# Patient Record
Sex: Female | Born: 1935 | Race: White | Hispanic: No | Marital: Married | Smoking: Never smoker
Health system: Southern US, Community
[De-identification: ages and names within clinical notes are randomized; demographics above are authoritative.]

## PROBLEM LIST (undated history)

## (undated) DIAGNOSIS — E079 Disorder of thyroid, unspecified: Secondary | ICD-10-CM

## (undated) DIAGNOSIS — F329 Major depressive disorder, single episode, unspecified: Secondary | ICD-10-CM

## (undated) DIAGNOSIS — F32A Depression, unspecified: Secondary | ICD-10-CM

## (undated) DIAGNOSIS — K219 Gastro-esophageal reflux disease without esophagitis: Secondary | ICD-10-CM

## (undated) DIAGNOSIS — F0390 Unspecified dementia without behavioral disturbance: Secondary | ICD-10-CM

## (undated) DIAGNOSIS — G510 Bell's palsy: Secondary | ICD-10-CM

## (undated) DIAGNOSIS — I10 Essential (primary) hypertension: Secondary | ICD-10-CM

---

## 1999-01-16 ENCOUNTER — Encounter: Admission: RE | Admit: 1999-01-16 | Discharge: 1999-04-16 | Payer: Self-pay | Admitting: Anesthesiology

## 1999-07-30 ENCOUNTER — Other Ambulatory Visit: Admission: RE | Admit: 1999-07-30 | Discharge: 1999-07-30 | Payer: Self-pay | Admitting: Internal Medicine

## 2001-03-01 ENCOUNTER — Encounter (INDEPENDENT_AMBULATORY_CARE_PROVIDER_SITE_OTHER): Payer: Self-pay | Admitting: Specialist

## 2001-03-01 ENCOUNTER — Ambulatory Visit: Admission: RE | Admit: 2001-03-01 | Discharge: 2001-03-01 | Payer: Self-pay | Admitting: Gastroenterology

## 2001-04-20 ENCOUNTER — Encounter: Admission: RE | Admit: 2001-04-20 | Discharge: 2001-04-20 | Payer: Self-pay | Admitting: Internal Medicine

## 2001-04-20 ENCOUNTER — Encounter: Payer: Self-pay | Admitting: Internal Medicine

## 2001-04-21 ENCOUNTER — Ambulatory Visit: Admission: RE | Admit: 2001-04-21 | Discharge: 2001-04-21 | Payer: Self-pay | Admitting: Internal Medicine

## 2005-12-04 ENCOUNTER — Ambulatory Visit (HOSPITAL_COMMUNITY): Admission: RE | Admit: 2005-12-04 | Discharge: 2005-12-04 | Payer: Self-pay | Admitting: Internal Medicine

## 2006-04-13 ENCOUNTER — Ambulatory Visit: Payer: Self-pay | Admitting: Internal Medicine

## 2006-04-21 ENCOUNTER — Ambulatory Visit: Payer: Self-pay | Admitting: Internal Medicine

## 2007-06-15 ENCOUNTER — Encounter: Admission: RE | Admit: 2007-06-15 | Discharge: 2007-06-15 | Payer: Self-pay | Admitting: Otolaryngology

## 2007-06-28 ENCOUNTER — Ambulatory Visit (HOSPITAL_BASED_OUTPATIENT_CLINIC_OR_DEPARTMENT_OTHER): Admission: RE | Admit: 2007-06-28 | Discharge: 2007-06-29 | Payer: Self-pay | Admitting: Urology

## 2008-11-28 ENCOUNTER — Telehealth: Payer: Self-pay | Admitting: Internal Medicine

## 2009-07-18 ENCOUNTER — Encounter: Admission: RE | Admit: 2009-07-18 | Discharge: 2009-07-18 | Payer: Self-pay | Admitting: Internal Medicine

## 2009-07-26 ENCOUNTER — Encounter: Admission: RE | Admit: 2009-07-26 | Discharge: 2009-07-26 | Payer: Self-pay | Admitting: Internal Medicine

## 2010-01-24 ENCOUNTER — Encounter: Admission: RE | Admit: 2010-01-24 | Discharge: 2010-01-24 | Payer: Self-pay | Admitting: Internal Medicine

## 2010-07-22 ENCOUNTER — Encounter: Admission: RE | Admit: 2010-07-22 | Discharge: 2010-07-22 | Payer: Self-pay | Admitting: Internal Medicine

## 2011-01-07 NOTE — Op Note (Signed)
NAMEBROOKELIN, FELBER             ACCOUNT NO.:  [id number]   MEDICAL RECORD NO.:  [id number]          PATIENT TYPE:  AMB   LOCATION:  [facility]                         FACILITY:  [facility]   PHYSICIAN:  Martina Sinner, MD DATE OF BIRTH:  Jan 02, 1932   DATE OF PROCEDURE:  06/28/2007  DATE OF DISCHARGE:                               OPERATIVE REPORT   ASSISTANT:  Dr. Allena Katz.   DIAGNOSIS:  Rectocele plus pelvic pain.   POSTOPERATIVE DIAGNOSIS:  Rectocele plus pelvic pain.   SURGERY:  Rectocele repair plus graft plus cystoscopy and  hydrodistention.   Pamela Cummings has a symptomatic rectocele and pelvic pain.  She has a past  history of vulvodynia.   The patient is prepped and draped in the usual fashion.  She is given  preoperative antibiotics.  Preoperative lab tests were normal.  Extra  care was taken to position her legs to minimize the risk of compartment  syndrome, neuropathy and DVT.   She is initially cystoscoped with the 21-French scope.  The bladder  mucosa and trigone were normal.  There is no stitch, foreign body or  carcinoma.  She was hydrodistention to approximately 850 mL.  Her  bladder was emptied and reexamined.  There were no glomerulations or  findings in keeping with a diagnosis of interstitial cystitis.   Under anesthesia she had a posterior defect.  Under anesthesia I did not  feel she had an enterocele.  She had a high cystocele that did not  require repair.   Two Allis clamps were was placed posteriorly on the hymenal ring.  I  removed a small triangle of perineal skin.  I then sharply opened up the  posterior vaginal wall for approximately 1 cm from the vaginal apex.  I  sharply dissected the rectovaginal fascia from the vaginal wall to the  pelvic sidewall.   I then used a #8 glove did a digital rectal examination.  She had  diffuse thinning of the posterior vaginal wall.  She also had an apical  defect possibly also due to the sharp dissection.   I did a  posterior repair with two layers in a running fashion with 2-0  Vicryl on an SH needle.  The first running suture flatten the posterior  defect very well.  It was not under tension.  I put approximately four  or five interrupted sutures for the second layer.   I did a little bit of sharp dissection near the apex and could feel the  levator muscles bilaterally.  I did not break through down to the  ischial spines.  I placed a flat thin malleable over the rectum and  placed 0 Vicryl on a UR6 needle in ileal coccygeus muscle both sides  nearer to the apex.  I placed two more sutures just to the pelvic  sidewall and rectovaginal fascia near the introitus.   4 x 7 dermal graft was soaked with antibiotic and saline.  It was  sutured in a four corner fashion leaving a trapdoor that flipped up over  any apical defect.  It was sewn to the vaginal wall  mucosa at the apex.   I trimmed approximately 3 or 4 mm of vaginal wall mucosa bilaterally.  The graft had laid in very nicely.  I did not sew it to the perineal  body.  I closed the posterior vaginal wall with running 2-0 Vicryl on a  CT1 needle.   There was excellent vaginal length.  There was good reduction of the  posterior defect.  There was no ring effect.  I did a digital rectal  examination.  There was no suture in the bladder.  The perineal skin was  closed in an up and down fashion with running 2-0 Vicryl.   Leg position was good at the end of the case.  The urine output was  good.  The Foley leg strap was applied.  The patient was taken to  recovery room.           ______________________________  Martina Sinner, MD  Electronically Signed     SAM/MEDQ  D:  06/28/2007  T:  06/29/2007  Job:  [id number]

## 2011-01-10 NOTE — Op Note (Signed)
[facility]  Patient:    [name], [name]                    MRN: [id number] Proc. Date: 03/01/01 Adm. Date:  [id number] Attending:  Rich Brave                           Operative Report  No dictation. DD:  03/01/01 TD:  03/01/01 Job: [id number]

## 2011-01-10 NOTE — Procedures (Signed)
[place]  Patient:    Pamela Cummings, Pamela Cummings                    MRN: [id number] Proc. Date: 03/01/01 Adm. Date:  [id number] Attending:  Rich Brave CC:         Richard A. Jacky Kindle, M.D.   Procedure Report  PROCEDURE:  Colonoscopy with biopsies.  INDICATION:  A 77 year old female with slightly irregular bowel habits, for colon cancer screening.  FINDINGS:  Diminutive polyp just above the cecum.  Otherwise normal to terminal ileum.  DESCRIPTION OF PROCEDURE:  The nature, purpose, and risks of this procedure had been discussed with the patient, who provided written consent.  Sedation for this procedure and the upper endoscopy which preceded it totalled fentanyl 50 mcg and Versed 5 mg IV without arrhythmias or desaturation (there were some spuriously low readings on the pulse oximeter at the beginning of the exam, but I do not feel that they were reflective of true abnormality in oxygen saturation since there was not a good wave form and the patient looked well).  The Olympus adult video colonoscope was advanced with slight difficulty through a somewhat fixated sigmoid region, which we were able to negotiate by having the patient in the supine position.  The terminal ileum was entered for a short distance and appeared normal.  Biopsies were obtained of it as well as randomly around the colon during pullback in view of the history of irregular bowel habits, which are felt most likely to be due to irritable bowel syndrome.  There was a diminutive 2 mm sessile polyp a short distance above the cecum, removed by one or two cold biopsies.  No other polyps were seen, and there was no evidence of cancer, colitis, vascular malformations, or significant diverticulosis.  There may have been a few diverticula in the sigmoid region. Retroflexion was not performed in the rectum due to a small rectal ampulla.  The patient tolerated the procedure well,  and there were no apparent complications.  IMPRESSION:  Diminutive polyp in the ascending colon.  Otherwise normal exam.  PLAN:  Await pathology on todays biopsies. DD:  03/01/01 TD:  03/01/01 Job: [id number]

## 2011-01-10 NOTE — Procedures (Signed)
[place]  Patient:    Pamela Cummings, Pamela Cummings                    MRN: [id number] Proc. Date: 03/01/01 Adm. Date:  [id number] Attending:  Rich Brave CC:         Richard A. Jacky Kindle, M.D.   Procedure Report  PROCEDURE:  Upper endoscopy.  INDICATION:  Long-standing reflux symptoms in a 77 year old female.  Her symptoms are reasonably well-controlled by the use of Prilosec once or twice daily but found that when she tried switching to Protonix at my suggestion, it did not work as well for her.  FINDINGS:  Small hiatal hernia with minimal esophageal ring.  Mild antral gastritis.  DESCRIPTION OF PROCEDURE:  The nature, purpose, and risks of the procedure had been discussed with the patient, who provided written consent.  Sedation was fentanyl 50 mcg and Versed 5 mg IV without arrhythmias or desaturation.  The Olympus adult video endoscope was passed under direct vision, but I was not able to see the vocal cords well, so I cannot comment on the presence or absence of reflux pharyngitis (the patient does have a tendency to cough when she gets reflux episodes).  The esophageal mucosa was entirely normal.  There was no evidence of free gastroesophageal reflux during this exam nor any reflux esophagitis, Barretts esophagus, or any varices, infection, or neoplasia.  There was a minimal esophageal ring at the squamocolumnar junction, and below this was a 2 cm hiatal hernia.  The stomach contained no significant residual.  There was "sandpaper erythema" in the prepyloric antral region, but no erosions, ulcers, polyps, or masses were observed anywhere in the stomach.  The pylorus, duodenal bulb, and second duodenum looked normal.  The scope was removed from the patient.  She tolerated the procedure well.  No biopsies were obtained.  There were no apparent complications.  IMPRESSION: 1. Small hiatal hernia with minimal esophageal ring. 2.  Prepyloric erythema of questionable clinical significance.  PLAN:  Resume Prilosec, since it works well for her.  Prescription provided for #60 with 11 refills.  Office follow-up p.r.n. DD:  03/01/01 TD:  03/01/01 Job: [id number]

## 2011-06-03 LAB — TYPE AND SCREEN
ABO/RH(D): O POS
Antibody Screen: NEGATIVE

## 2011-06-03 LAB — PROTIME-INR: Prothrombin Time: 12.7

## 2011-06-03 LAB — ABO/RH: ABO/RH(D): O POS

## 2011-06-03 LAB — CBC
HCT: 38.3
Hemoglobin: 11.6 — ABNORMAL LOW
Hemoglobin: 13.2
MCHC: 34.7
MCV: 87.4
MCV: 88.4
MCV: 88.5
RBC: 4.38
RDW: 13.5
RDW: 14
WBC: 6.4
WBC: 8.1

## 2011-06-03 LAB — DIFFERENTIAL
Eosinophils Absolute: 0.1
Eosinophils Relative: 2
Lymphocytes Relative: 22
Lymphs Abs: 1.4
Monocytes Absolute: 0.5
Monocytes Relative: 9

## 2011-06-03 LAB — BASIC METABOLIC PANEL
Chloride: 99
GFR calc non Af Amer: >60
Potassium: 3.7
Sodium: 139

## 2011-09-18 ENCOUNTER — Ambulatory Visit: Payer: Medicare Other | Attending: Internal Medicine | Admitting: Physical Therapy

## 2011-09-18 DIAGNOSIS — R269 Unspecified abnormalities of gait and mobility: Secondary | ICD-10-CM | POA: Insufficient documentation

## 2011-09-18 DIAGNOSIS — IMO0001 Reserved for inherently not codable concepts without codable children: Secondary | ICD-10-CM | POA: Insufficient documentation

## 2011-09-29 ENCOUNTER — Ambulatory Visit: Payer: Medicare Other | Attending: Internal Medicine | Admitting: Physical Therapy

## 2011-09-29 DIAGNOSIS — R269 Unspecified abnormalities of gait and mobility: Secondary | ICD-10-CM | POA: Insufficient documentation

## 2011-09-29 DIAGNOSIS — IMO0001 Reserved for inherently not codable concepts without codable children: Secondary | ICD-10-CM | POA: Insufficient documentation

## 2011-10-02 ENCOUNTER — Ambulatory Visit: Payer: Medicare Other | Admitting: Physical Therapy

## 2011-10-06 ENCOUNTER — Ambulatory Visit: Payer: Medicare Other | Admitting: Physical Therapy

## 2011-10-09 ENCOUNTER — Ambulatory Visit: Payer: Medicare Other | Admitting: Physical Therapy

## 2011-10-14 ENCOUNTER — Encounter: Payer: Self-pay | Admitting: Physical Therapy

## 2011-10-14 ENCOUNTER — Ambulatory Visit: Payer: Medicare Other | Admitting: Physical Therapy

## 2011-10-16 ENCOUNTER — Encounter: Payer: Self-pay | Admitting: Physical Therapy

## 2011-10-20 ENCOUNTER — Encounter: Payer: Self-pay | Admitting: Physical Therapy

## 2011-10-23 ENCOUNTER — Encounter: Payer: Self-pay | Admitting: Physical Therapy

## 2011-11-10 ENCOUNTER — Ambulatory Visit: Payer: Medicare Other | Attending: Internal Medicine | Admitting: Physical Therapy

## 2011-11-10 DIAGNOSIS — R269 Unspecified abnormalities of gait and mobility: Secondary | ICD-10-CM | POA: Insufficient documentation

## 2011-11-10 DIAGNOSIS — IMO0001 Reserved for inherently not codable concepts without codable children: Secondary | ICD-10-CM | POA: Insufficient documentation

## 2011-11-12 ENCOUNTER — Ambulatory Visit: Payer: Medicare Other | Admitting: Physical Therapy

## 2011-11-17 ENCOUNTER — Encounter: Payer: PRIVATE HEALTH INSURANCE | Admitting: Physical Therapy

## 2011-11-18 ENCOUNTER — Encounter: Payer: PRIVATE HEALTH INSURANCE | Admitting: Physical Therapy

## 2011-11-18 ENCOUNTER — Ambulatory Visit: Payer: Medicare Other | Admitting: *Deleted

## 2011-11-24 ENCOUNTER — Ambulatory Visit: Payer: Medicare Other | Attending: Internal Medicine | Admitting: Physical Therapy

## 2011-11-24 DIAGNOSIS — IMO0001 Reserved for inherently not codable concepts without codable children: Secondary | ICD-10-CM | POA: Insufficient documentation

## 2011-11-24 DIAGNOSIS — R269 Unspecified abnormalities of gait and mobility: Secondary | ICD-10-CM | POA: Insufficient documentation

## 2011-11-26 ENCOUNTER — Ambulatory Visit: Payer: Medicare Other | Admitting: Physical Therapy

## 2011-12-01 ENCOUNTER — Encounter: Payer: PRIVATE HEALTH INSURANCE | Admitting: Physical Therapy

## 2011-12-04 ENCOUNTER — Ambulatory Visit: Payer: Medicare Other | Admitting: Physical Therapy

## 2011-12-08 ENCOUNTER — Encounter: Payer: PRIVATE HEALTH INSURANCE | Admitting: Physical Therapy

## 2011-12-08 ENCOUNTER — Ambulatory Visit: Payer: Medicare Other | Admitting: Physical Therapy

## 2011-12-11 ENCOUNTER — Ambulatory Visit: Payer: Medicare Other | Admitting: Physical Therapy

## 2011-12-11 ENCOUNTER — Encounter: Payer: PRIVATE HEALTH INSURANCE | Admitting: Physical Therapy

## 2011-12-15 ENCOUNTER — Ambulatory Visit: Payer: Medicare Other | Admitting: Physical Therapy

## 2011-12-16 ENCOUNTER — Ambulatory Visit: Payer: Medicare Other | Admitting: Physical Therapy

## 2011-12-22 ENCOUNTER — Ambulatory Visit: Payer: Medicare Other | Admitting: Physical Therapy

## 2011-12-23 ENCOUNTER — Ambulatory Visit: Payer: Medicare Other | Admitting: Physical Therapy

## 2011-12-30 ENCOUNTER — Ambulatory Visit: Payer: Medicare Other | Attending: Internal Medicine | Admitting: Physical Therapy

## 2011-12-30 DIAGNOSIS — R269 Unspecified abnormalities of gait and mobility: Secondary | ICD-10-CM | POA: Insufficient documentation

## 2011-12-30 DIAGNOSIS — IMO0001 Reserved for inherently not codable concepts without codable children: Secondary | ICD-10-CM | POA: Insufficient documentation

## 2012-01-01 ENCOUNTER — Ambulatory Visit: Payer: Medicare Other | Admitting: Physical Therapy

## 2012-01-05 ENCOUNTER — Ambulatory Visit: Payer: Medicare Other | Admitting: Physical Therapy

## 2012-01-08 ENCOUNTER — Ambulatory Visit: Payer: Medicare Other | Admitting: Physical Therapy

## 2012-01-12 ENCOUNTER — Ambulatory Visit: Payer: Medicare Other | Admitting: Physical Therapy

## 2012-01-15 ENCOUNTER — Ambulatory Visit: Payer: Medicare Other | Admitting: Physical Therapy

## 2012-09-13 ENCOUNTER — Ambulatory Visit: Payer: Medicare Other | Attending: Internal Medicine | Admitting: Physical Therapy

## 2012-09-13 DIAGNOSIS — IMO0001 Reserved for inherently not codable concepts without codable children: Secondary | ICD-10-CM | POA: Insufficient documentation

## 2012-09-13 DIAGNOSIS — R269 Unspecified abnormalities of gait and mobility: Secondary | ICD-10-CM | POA: Insufficient documentation

## 2012-09-16 ENCOUNTER — Ambulatory Visit: Payer: Medicare Other | Admitting: Physical Therapy

## 2012-09-20 ENCOUNTER — Ambulatory Visit: Payer: Medicare Other | Admitting: Physical Therapy

## 2012-09-23 ENCOUNTER — Ambulatory Visit: Payer: Medicare Other | Admitting: Physical Therapy

## 2012-09-28 ENCOUNTER — Ambulatory Visit: Payer: Medicare Other | Attending: Internal Medicine | Admitting: Physical Therapy

## 2012-09-28 DIAGNOSIS — R269 Unspecified abnormalities of gait and mobility: Secondary | ICD-10-CM | POA: Insufficient documentation

## 2012-09-28 DIAGNOSIS — IMO0001 Reserved for inherently not codable concepts without codable children: Secondary | ICD-10-CM | POA: Insufficient documentation

## 2012-09-30 ENCOUNTER — Ambulatory Visit: Payer: Medicare Other | Admitting: Physical Therapy

## 2012-10-04 ENCOUNTER — Ambulatory Visit: Payer: Medicare Other | Admitting: Physical Therapy

## 2012-10-07 ENCOUNTER — Ambulatory Visit: Payer: Medicare Other | Admitting: Physical Therapy

## 2012-10-11 ENCOUNTER — Ambulatory Visit: Payer: Medicare Other | Admitting: Physical Therapy

## 2012-10-14 ENCOUNTER — Ambulatory Visit: Payer: Medicare Other | Admitting: Physical Therapy

## 2012-10-26 ENCOUNTER — Ambulatory Visit: Payer: Medicare Other | Admitting: Physical Therapy

## 2013-01-01 ENCOUNTER — Inpatient Hospital Stay (HOSPITAL_COMMUNITY)
Admission: EM | Admit: 2013-01-01 | Discharge: 2013-01-05 | DRG: 419 | Disposition: A | Discharge status: Skilled Nursing Facility | Payer: Medicare Other | Attending: General Surgery | Admitting: General Surgery

## 2013-01-01 ENCOUNTER — Emergency Department (HOSPITAL_COMMUNITY): Payer: Medicare Other

## 2013-01-01 ENCOUNTER — Other Ambulatory Visit: Payer: Self-pay

## 2013-01-01 ENCOUNTER — Encounter (HOSPITAL_COMMUNITY): Payer: Self-pay | Admitting: Emergency Medicine

## 2013-01-01 DIAGNOSIS — R7402 Elevation of levels of lactic acid dehydrogenase (LDH): Secondary | ICD-10-CM

## 2013-01-01 DIAGNOSIS — I10 Essential (primary) hypertension: Secondary | ICD-10-CM | POA: Diagnosis present

## 2013-01-01 DIAGNOSIS — R112 Nausea with vomiting, unspecified: Secondary | ICD-10-CM

## 2013-01-01 DIAGNOSIS — K219 Gastro-esophageal reflux disease without esophagitis: Secondary | ICD-10-CM | POA: Diagnosis present

## 2013-01-01 DIAGNOSIS — M549 Dorsalgia, unspecified: Secondary | ICD-10-CM | POA: Diagnosis present

## 2013-01-01 DIAGNOSIS — R509 Fever, unspecified: Secondary | ICD-10-CM

## 2013-01-01 DIAGNOSIS — K660 Peritoneal adhesions (postprocedural) (postinfection): Secondary | ICD-10-CM | POA: Diagnosis present

## 2013-01-01 DIAGNOSIS — G8929 Other chronic pain: Secondary | ICD-10-CM | POA: Diagnosis present

## 2013-01-01 DIAGNOSIS — R197 Diarrhea, unspecified: Secondary | ICD-10-CM | POA: Diagnosis not present

## 2013-01-01 DIAGNOSIS — R933 Abnormal findings on diagnostic imaging of other parts of digestive tract: Secondary | ICD-10-CM

## 2013-01-01 DIAGNOSIS — K8010 Calculus of gallbladder with chronic cholecystitis without obstruction: Secondary | ICD-10-CM

## 2013-01-01 DIAGNOSIS — R070 Pain in throat: Secondary | ICD-10-CM | POA: Diagnosis not present

## 2013-01-01 DIAGNOSIS — K8000 Calculus of gallbladder with acute cholecystitis without obstruction: Principal | ICD-10-CM

## 2013-01-01 DIAGNOSIS — Z79899 Other long term (current) drug therapy: Secondary | ICD-10-CM

## 2013-01-01 DIAGNOSIS — F0390 Unspecified dementia without behavioral disturbance: Secondary | ICD-10-CM | POA: Diagnosis present

## 2013-01-01 HISTORY — DX: Essential (primary) hypertension: I10

## 2013-01-01 HISTORY — DX: Unspecified dementia, unspecified severity, without behavioral disturbance, psychotic disturbance, mood disturbance, and anxiety: F03.90

## 2013-01-01 HISTORY — DX: Gastro-esophageal reflux disease without esophagitis: K21.9

## 2013-01-01 LAB — COMPREHENSIVE METABOLIC PANEL
ALT: 215 U/L — ABNORMAL HIGH (ref 0–35)
AST: 231 U/L — ABNORMAL HIGH (ref 0–37)
Albumin: 3.2 g/dL — ABNORMAL LOW (ref 3.5–5.2)
CO2: 27 mEq/L (ref 19–32)
Calcium: 9.1 mg/dL (ref 8.4–10.5)
Creatinine, Ser: 0.82 mg/dL (ref 0.50–1.10)
GFR calc non Af Amer: 67 mL/min — ABNORMAL LOW (ref >90)
Sodium: 135 mEq/L (ref 135–145)

## 2013-01-01 LAB — URINALYSIS, ROUTINE W REFLEX MICROSCOPIC
Glucose, UA: NEGATIVE mg/dL
Hgb urine dipstick: NEGATIVE
Specific Gravity, Urine: 1.012 (ref 1.005–1.030)
pH: 5.5 (ref 5.0–8.0)

## 2013-01-01 LAB — CBC
HCT: 37.4 % (ref 36.0–46.0)
MCH: 30.8 pg (ref 26.0–34.0)
MCH: 30.6 pg (ref 26.0–34.0)
MCV: 90.8 fL (ref 78.0–100.0)
Platelets: 180 10*3/uL (ref 150–400)
Platelets: 156 10*3/uL (ref 150–400)
RBC: 4.28 MIL/uL (ref 3.87–5.11)
RBC: 4.12 MIL/uL (ref 3.87–5.11)
RDW: 14.3 % (ref 11.5–15.5)
WBC: 7.4 10*3/uL (ref 4.0–10.5)

## 2013-01-01 LAB — LIPASE, BLOOD: Lipase: 18 U/L (ref 11–59)

## 2013-01-01 LAB — TROPONIN I: Troponin I: <0.3 ng/mL (ref <0.30)

## 2013-01-01 MED ORDER — ATENOLOL 50 MG PO TABS
50.0000 mg | ORAL_TABLET | Freq: Every day | ORAL | Status: DC
  Administered 2013-01-03 – 2013-01-05 (×3): 50 mg via ORAL
  Filled 2013-01-01 (×4): qty 1

## 2013-01-01 MED ORDER — GABAPENTIN 300 MG PO CAPS
300.0000 mg | ORAL_CAPSULE | Freq: Every day | ORAL | Status: DC
  Administered 2013-01-02 – 2013-01-05 (×4): 300 mg via ORAL
  Filled 2013-01-01 (×4): qty 1

## 2013-01-01 MED ORDER — PIPERACILLIN-TAZOBACTAM 3.375 G IVPB
3.3750 g | Freq: Three times a day (TID) | INTRAVENOUS | Status: DC
  Administered 2013-01-01 – 2013-01-05 (×11): 3.375 g via INTRAVENOUS
  Filled 2013-01-01 (×13): qty 50

## 2013-01-01 MED ORDER — VILAZODONE HCL 40 MG PO TABS
40.0000 mg | ORAL_TABLET | Freq: Every day | ORAL | Status: DC
  Administered 2013-01-02 – 2013-01-05 (×4): 40 mg via ORAL
  Filled 2013-01-01 (×5): qty 1

## 2013-01-01 MED ORDER — ZOLPIDEM TARTRATE 5 MG PO TABS
5.0000 mg | ORAL_TABLET | Freq: Every evening | ORAL | Status: DC | PRN
  Administered 2013-01-02 – 2013-01-04 (×3): 5 mg via ORAL
  Filled 2013-01-01 (×5): qty 1

## 2013-01-01 MED ORDER — PANTOPRAZOLE SODIUM 40 MG IV SOLR
40.0000 mg | Freq: Every day | INTRAVENOUS | Status: DC
  Administered 2013-01-01 – 2013-01-04 (×5): 40 mg via INTRAVENOUS
  Filled 2013-01-01 (×7): qty 40

## 2013-01-01 MED ORDER — POTASSIUM CHLORIDE 10 MEQ/100ML IV SOLN
10.0000 meq | Freq: Once | INTRAVENOUS | Status: AC
  Administered 2013-01-01: 10 meq via INTRAVENOUS
  Filled 2013-01-01: qty 100

## 2013-01-01 MED ORDER — SODIUM CHLORIDE 0.9 % IV BOLUS (SEPSIS)
500.0000 mL | Freq: Once | INTRAVENOUS | Status: AC
  Administered 2013-01-01: 500 mL via INTRAVENOUS

## 2013-01-01 MED ORDER — CYCLOBENZAPRINE HCL 10 MG PO TABS
10.0000 mg | ORAL_TABLET | Freq: Every day | ORAL | Status: DC
  Administered 2013-01-01 – 2013-01-04 (×5): 10 mg via ORAL
  Filled 2013-01-01 (×7): qty 1

## 2013-01-01 MED ORDER — POTASSIUM CHLORIDE IN NACL 20-0.9 MEQ/L-% IV SOLN
INTRAVENOUS | Status: DC
  Administered 2013-01-01: 19:00:00 via INTRAVENOUS
  Filled 2013-01-01 (×13): qty 1000

## 2013-01-01 MED ORDER — ONDANSETRON HCL 4 MG/2ML IJ SOLN
4.0000 mg | Freq: Once | INTRAMUSCULAR | Status: AC
  Administered 2013-01-01: 4 mg via INTRAVENOUS
  Filled 2013-01-01: qty 2

## 2013-01-01 MED ORDER — DONEPEZIL HCL 10 MG PO TABS
10.0000 mg | ORAL_TABLET | Freq: Every day | ORAL | Status: DC
  Administered 2013-01-01 – 2013-01-04 (×5): 10 mg via ORAL
  Filled 2013-01-01 (×6): qty 1

## 2013-01-01 MED ORDER — PIPERACILLIN-TAZOBACTAM 3.375 G IVPB
3.3750 g | Freq: Once | INTRAVENOUS | Status: AC
  Administered 2013-01-01: 3.375 g via INTRAVENOUS
  Filled 2013-01-01: qty 50

## 2013-01-01 MED ORDER — MORPHINE SULFATE 2 MG/ML IJ SOLN
2.0000 mg | INTRAMUSCULAR | Status: DC | PRN
  Filled 2013-01-01 (×2): qty 1

## 2013-01-01 MED ORDER — ATENOLOL-CHLORTHALIDONE 50-25 MG PO TABS: 1.0000 | ORAL_TABLET | Freq: Every day | ORAL | Status: DC

## 2013-01-01 MED ORDER — ENOXAPARIN SODIUM 30 MG/0.3ML ~~LOC~~ SOLN
30.0000 mg | SUBCUTANEOUS | Status: DC
  Administered 2013-01-01 – 2013-01-02 (×2): 30 mg via SUBCUTANEOUS
  Filled 2013-01-01 (×4): qty 0.3

## 2013-01-01 MED ORDER — ONDANSETRON HCL 4 MG/2ML IJ SOLN: 4.0000 mg | Freq: Four times a day (QID) | INTRAMUSCULAR | Status: DC | PRN

## 2013-01-01 MED ORDER — CHLORTHALIDONE 25 MG PO TABS
25.0000 mg | ORAL_TABLET | Freq: Every day | ORAL | Status: DC
  Administered 2013-01-02 – 2013-01-05 (×4): 25 mg via ORAL
  Filled 2013-01-01 (×5): qty 1

## 2013-01-01 MED ORDER — ENOXAPARIN SODIUM 30 MG/0.3ML ~~LOC~~ SOLN: 30.0000 mg | SUBCUTANEOUS | Status: DC

## 2013-01-01 NOTE — Consult Note (Signed)
HISTORY OF PRESENT ILLNESS:  Pamela Cummings is a 79 y.o. female with minimal medical problems who presents to the emergency room after several days of abdominal complaints. The patient's husband, retired physician, provide significant history. Basically, she was having some upper abdominal and back discomfort. Had some nausea and vomiting. Became weak. At the hospital was noted to have abnormal liver tests with SGOT 231, SGPT 215, and total bilirubin 5.2. Her alkaline phosphatase was normal at 112. Abdominal ultrasound revealed thickening of the gallbladder wall with pericholecystic fluid and gallstones. No intrahepatic or extrahepatic dilation. She is now on antibiotics resting comfortably  REVIEW OF SYSTEMS:  All non-GI ROS negative except for urinary frequency and difficulty walking. Weakness  Past Medical History  Diagnosis Date  . Dementia   . Hypertension   . GERD (gastroesophageal reflux disease)     History reviewed. No pertinent past surgical history.  Social History Pamela Cummings  has no tobacco, alcohol, and drug history on file.  FAMILY history No family history of relevant GI disorders  No Known Allergies     PHYSICAL EXAMINATION: Vital signs: BP 144/66  Pulse 61  Temp(Src) 98.7 F (37.1 C) (Rectal)  Resp 17  SpO2 93%  Constitutional: generally well-appearing, no acute distress Psychiatric: alert and oriented x3, cooperative Eyes: extraocular movements intact, anicteric, conjunctiva pink Mouth: oral pharynx moist, no lesions Neck: supple no lymphadenopathy Cardiovascular: heart regular rate and rhythm, no murmur Lungs: clear to auscultation bilaterally Abdomen: soft, obese, nontender, nondistended, no obvious ascites, no peritoneal signs, normal bowel sounds, no organomegaly Rectal: Omitted Extremities: no lower extremity edema bilaterally Skin: no lesions on visible extremities Neuro: No focal deficits.   ASSESSMENT:  #1. Calculus Cholecystitis. I  suspect this was cause for her recent illness. Currently with benign abdomen. With normal alkaline phosphatase and nondilated biliary system, I don't suspect choledocholithiasis.   PLAN:  #1. Recommend laparoscopic cholecystectomy with intraoperative cholangiogram. If she were to have a common duct stone, we could address this postoperatively. I have discussed this with the patient, her husband, her son, the ER attending physician, and the general surgeon Dr. Magnus Ivan. Thank you  Wilhemina Bonito. Eda Keys., M.D. [facility] Division of Gastroenterology

## 2013-01-01 NOTE — ED Notes (Signed)
Pt returned from US

## 2013-01-01 NOTE — ED Notes (Signed)
Pt transported to Xray. 

## 2013-01-01 NOTE — H&P (Signed)
Pamela Cummings is an 79 y.o. female.   Chief Complaint: Back pain, nausea and vomiting HPI: This is a 79 year old female I have been asked to see for the above-mentioned complaints. She has a history of slight dementia. Her husband is a Development worker, community. He reports that she started having back pain several days ago. She has a history of chronic back pain since he did not think much of this. Bowel movements were normal. He awoke the next day and his wife told she had been having nausea and vomiting throughout the night. She seemed to improve but then became weak and he noticed jaundiced and brought her to the emergency room. Currently, she denies any abdominal pain or nausea. She has no previous history of similar attacks. She denies fevers or chills.  Past Medical History  Diagnosis Date  . Dementia   . Hypertension   . GERD (gastroesophageal reflux disease)     History reviewed. No pertinent past surgical history.  History reviewed. No pertinent family history. Social History:  has no tobacco, alcohol, and drug history on file.  Allergies: No Known Allergies   (Not in a hospital admission)  Results for orders placed during the hospital encounter of 01/01/13 (from the past 48 hour(s))  CBC     Status: None   Collection Time    01/01/13 10:55 AM      Result Value Range   WBC 7.4  4.0 - 10.5 K/uL   RBC 4.28  3.87 - 5.11 MIL/uL   Hemoglobin 13.2  12.0 - 15.0 g/dL   HCT 66.4  40.3 - 47.4 %   MCV 89.5  78.0 - 100.0 fL   MCH 30.8  26.0 - 34.0 pg   MCHC 34.5  30.0 - 36.0 g/dL   RDW 25.9  56.3 - 87.5 %   Platelets 180  150 - 400 K/uL  COMPREHENSIVE METABOLIC PANEL     Status: Abnormal   Collection Time    01/01/13 10:55 AM      Result Value Range   Sodium 135  135 - 145 mEq/L   Potassium 3.0 (*) 3.5 - 5.1 mEq/L   Chloride 97  96 - 112 mEq/L   CO2 27  19 - 32 mEq/L   Glucose, Bld 110 (*) 70 - 99 mg/dL   BUN 10  6 - 23 mg/dL   Creatinine, Ser 6.43  0.50 - 1.10 mg/dL   Calcium 9.1   8.4 - 32.9 mg/dL   Total Protein 6.5  6.0 - 8.3 g/dL   Albumin 3.2 (*) 3.5 - 5.2 g/dL   AST 518 (*) 0 - 37 U/L   ALT 215 (*) 0 - 35 U/L   Alkaline Phosphatase 112  39 - 117 U/L   Total Bilirubin 5.2 (*) 0.3 - 1.2 mg/dL   GFR calc non Af Amer 67 (*) >90 mL/min   GFR calc Af Amer 77 (*) >90 mL/min   Comment:            The eGFR has been calculated     using the CKD EPI equation.     This calculation has not been     validated in all clinical     situations.     eGFR's persistently     <90 mL/min signify     possible Chronic Kidney Disease.  TROPONIN I     Status: None   Collection Time    01/01/13 10:55 AM      Result Value Range  Troponin I <0.30  <0.30 ng/mL   Comment:            Due to the release kinetics of cTnI,     a negative result within the first hours     of the onset of symptoms does not rule out     myocardial infarction with certainty.     If myocardial infarction is still suspected,     repeat the test at appropriate intervals.  LIPASE, BLOOD     Status: None   Collection Time    01/01/13 11:54 AM      Result Value Range   Lipase 18  11 - 59 U/L  URINALYSIS, ROUTINE W REFLEX MICROSCOPIC     Status: Abnormal   Collection Time    01/01/13  1:29 PM      Result Value Range   Color, Urine AMBER (*) YELLOW   Comment: BIOCHEMICALS MAY BE AFFECTED BY COLOR   APPearance CLEAR  CLEAR   Specific Gravity, Urine 1.012  1.005 - 1.030   pH 5.5  5.0 - 8.0   Glucose, UA NEGATIVE  NEGATIVE mg/dL   Hgb urine dipstick NEGATIVE  NEGATIVE   Bilirubin Urine LARGE (*) NEGATIVE   Ketones, ur 15 (*) NEGATIVE mg/dL   Protein, ur NEGATIVE  NEGATIVE mg/dL   Urobilinogen, UA 1.0  0.0 - 1.0 mg/dL   Nitrite NEGATIVE  NEGATIVE   Leukocytes, UA NEGATIVE  NEGATIVE   Comment: MICROSCOPIC NOT DONE ON URINES WITH NEGATIVE PROTEIN, BLOOD, LEUKOCYTES, NITRITE, OR GLUCOSE <1000 mg/dL.   Dg Chest 2 View  01/01/2013  *RADIOLOGY REPORT*  Clinical Data: Weakness. Shortness of breath.  CHEST -  2 VIEW  Comparison: 06/28/2007  Findings: Tortuous thoracic aorta noted with prominence of the ascending thoracic aorta.  No cardiomegaly.  No pleural effusion. Mild thoracic spondylosis is present.  Low lung volumes observed on the frontal view with suspected subsegmental atelectasis at the lung bases.  IMPRESSION:  1.  Subsegmental atelectasis at the lung bases. 2.  Prominence the ascending aorta may be due to tortuosity or conceivably ascending aortic aneurysm.   Original Report Authenticated By: Gaylyn Rong, M.D.    US Abdomen Complete  01/01/2013  *RADIOLOGY REPORT*  Clinical Data:  79 year old female with abdominal pain.  ABDOMINAL ULTRASOUND COMPLETE  Comparison:  None  Findings:  Gallbladder:  Multiple small mobile gallstones are identified, the largest measuring 8 mm.  Gallbladder wall thickening and small amount of pericholecystic fluid is identified.  These findings likely represent acute cholecystitis.  Common Bile Duct:  There is no evidence of intrahepatic or extrahepatic biliary dilation. The CBD measures 6.0 mm in greatest diameter.  Liver: Mild increased echogenicity of the liver is identified.  No focal abnormalities are identified.  IVC:  Appears normal.  Pancreas:  Although the pancreas is difficult to visualize in its entirety, no focal pancreatic abnormality is identified.  Spleen:  Within normal limits in size and echotexture.  Right kidney:  The right kidney is normal in size and parenchymal echogenicity.  There is no evidence of solid mass, hydronephrosis or definite renal calculi.  The right kidney measures  10.2 cm.  Left kidney:  The left kidney is normal in size and parenchymal echogenicity.  There is no evidence of solid mass, hydronephrosis or definite renal calculi.   The left kidney measures 9.4 cm.  Abdominal Aorta:  No abdominal aortic aneurysm identified.  There is no evidence of ascites.  IMPRESSION: Cholelithiasis with gallbladder wall thickening  and pericholecystic  fluid - likely representing acute cholecystitis.  No evidence of biliary dilatation.  Increased hepatic echogenicity - question fatty infiltration.   Original Report Authenticated By: Harmon Pier, M.D.     Review of Systems  All other systems reviewed and are negative.    Blood pressure 126/61, pulse 70, temperature 98.7 F (37.1 C), temperature source Rectal, resp. rate 17, SpO2 94.00%. Physical Exam  Constitutional: She is oriented to person, place, and time. She appears well-developed and well-nourished. No distress.  HENT:  Head: Normocephalic and atraumatic.  Right Ear: External ear normal.  Left Ear: External ear normal.  Nose: Nose normal.  Mouth/Throat: Oropharynx is clear and moist.  Eyes: Conjunctivae are normal. Pupils are equal, round, and reactive to light. Right eye exhibits no discharge. Left eye exhibits no discharge. Scleral icterus is present.  Neck: Normal range of motion. Neck supple. No tracheal deviation present. No thyromegaly present.  Cardiovascular: Normal rate, regular rhythm, normal heart sounds and intact distal pulses.   No murmur heard. Respiratory: Effort normal and breath sounds normal. No respiratory distress. She has no wheezes. She has no rales.  GI: Soft. Bowel sounds are normal. She exhibits no distension and no mass. There is no tenderness. There is no rebound and no guarding.  Musculoskeletal: Normal range of motion. She exhibits no edema and no tenderness.  Lymphadenopathy:    She has no cervical adenopathy.  Neurological: She is alert and oriented to person, place, and time.  Skin: Skin is warm and dry. She is not diaphoretic.  Obvious jaundice  Psychiatric: Her behavior is normal.     Assessment/Plan Cholelithiasis, possible cholecystitis, possible common bile duct stone  This is not a clear-cut picture. Her white blood count is normal and she has no tenderness on physical examination. She is jaundiced and her bilirubin is quite elevated  although the ultrasound showed no dilation of the bile duct. I am worried that this may represent a gallbladder malignancy. She will be admitted to the hospital. I am going to get an MRCP to better define her anatomy. We will repeat her liver function tests in the morning. Hopefully, this just represents symptomatic cholelithiasis. Her husband is in agreement to the plans.  Karon Cotterill A 01/01/2013, 4:28 PM

## 2013-01-01 NOTE — ED Notes (Signed)
MD at bedside. 

## 2013-01-01 NOTE — ED Notes (Signed)
Dr. Blackman at bedside. 

## 2013-01-01 NOTE — Consult Note (Signed)
PCP:   Annabella Elford   Chief Complaint:  Back pain, n/v  HPI: Asked to see patient for admission. Gi and Surgery notes reviewed.  Sick for 2-3 days.  Some back pain and few episodes of n/v--all by husband as patients hx is difficult , mild dementia.  Baseline health stable--htn, dementia, lipids. No fevers or unexplained weight loss.  BM/GU normal. No cardiopulm sxs.   Past Medical History: Past Medical History  Diagnosis Date  . Dementia   . Hypertension   . GERD (gastroesophageal reflux disease)    History reviewed. No pertinent past surgical history.  Medications: Prior to Admission medications   Medication Sig Start Date End Date Taking? Authorizing Provider  aspirin EC 81 MG tablet Take 81 mg by mouth daily.   Yes Historical Provider, MD  atenolol-chlorthalidone (TENORETIC) 50-25 MG per tablet Take 1 tablet by mouth daily.   Yes Historical Provider, MD  buPROPion (WELLBUTRIN SR) 150 MG 12 hr tablet Take 150 mg by mouth daily.   Yes Historical Provider, MD  celecoxib (CELEBREX) 200 MG capsule Take 200 mg by mouth daily.   Yes Historical Provider, MD  clotrimazole-betamethasone (LOTRISONE) cream Apply 1 application topically 2 (two) times daily as needed (for rash).   Yes Historical Provider, MD  cyclobenzaprine (FLEXERIL) 10 MG tablet Take 10 mg by mouth at bedtime.   Yes Historical Provider, MD  donepezil (ARICEPT) 10 MG tablet Take 10 mg by mouth at bedtime.   Yes Historical Provider, MD  estrogens, conjugated, (PREMARIN) 0.625 MG tablet Take 0.625 mg by mouth daily.   Yes Historical Provider, MD  gabapentin (NEURONTIN) 300 MG capsule Take 300 mg by mouth daily.   Yes Historical Provider, MD  GLUCOSAMINE HCL PO Take 1,000 mg by mouth daily.   Yes Historical Provider, MD  memantine (NAMENDA) 10 MG tablet Take 10 mg by mouth 2 (two) times daily.   Yes Historical Provider, MD  omeprazole (PRILOSEC) 20 MG capsule Take 20 mg by mouth daily.   Yes Historical Provider, MD  Vilazodone HCl  (VIIBRYD) 40 MG TABS Take 40 mg by mouth daily.   Yes Historical Provider, MD  zolpidem (AMBIEN) 10 MG tablet Take 10 mg by mouth at bedtime as needed for sleep.   Yes Historical Provider, MD    Allergies:  No Known Allergies  Social History:  has no tobacco, alcohol, and drug history on file.  Family History: History reviewed. No pertinent family history.  Physical Exam: Filed Vitals:   01/01/13 1403 01/01/13 1505 01/01/13 1600 01/01/13 1700  BP: 140/63 144/66 126/61 143/62  Pulse: 68 61 70 62  Temp: 97.8 F (36.6 C) 98.7 F (37.1 C)    TempSrc:      Resp: 18 17    SpO2: 96% 93% 94% 97%   General appearance: alert, cooperative, appears stated age and no distress Head: Normocephalic, without obvious abnormality, atraumatic, mild icterus Eyes: conjunctivae/corneas clear. PERRL, EOM's intact.  Nose: Nares normal. Septum midline. Mucosa normal. No drainage or sinus tenderness. Throat: lips, mucosa, and tongue normal; teeth and gums normal Neck: no adenopathy, no carotid bruit, no JVD and thyroid not enlarged, symmetric, no tenderness/mass/nodules Resp: clear to auscultation bilaterally Cardio: regular rate and rhythm, S1, S2 normal, no murmur, click, rub or gallop GI: soft, non-tender; bowel sounds normal; no masses,  no organomegaly Extremities: extremities normal, atraumatic, no cyanosis or edema Pulses: 2+ and symmetric Lymph nodes: Cervical adenopathy: no cervical lymphadenopathy Neurologic: Alert and oriented X 3, normal strength and tone.  Normal symmetric reflexes, mild confusion regarding circumstances.    Labs on Admission:   Recent Labs  01/01/13 1055  NA 135  K 3.0*  CL 97  CO2 27  GLUCOSE 110*  BUN 10  CREATININE 0.82  CALCIUM 9.1    Recent Labs  01/01/13 1055  AST 231*  ALT 215*  ALKPHOS 112  BILITOT 5.2*  PROT 6.5  ALBUMIN 3.2*    Recent Labs  01/01/13 1154  LIPASE 18    Recent Labs  01/01/13 1055  WBC 7.4  HGB 13.2  HCT 38.3   MCV 89.5  PLT 180    Recent Labs  01/01/13 1055  TROPONINI <0.30   No results found for this basename: TSH, T4TOTAL, FREET3, T3FREE, THYROIDAB,  in the last 72 hours No results found for this basename: VITAMINB12, FOLATE, FERRITIN, TIBC, IRON, RETICCTPCT,  in the last 72 hours  Radiological Exams on Admission: Dg Chest 2 View  01/01/2013  *RADIOLOGY REPORT*  Clinical Data: Weakness. Shortness of breath.  CHEST - 2 VIEW  Comparison: 06/28/2007  Findings: Tortuous thoracic aorta noted with prominence of the ascending thoracic aorta.  No cardiomegaly.  No pleural effusion. Mild thoracic spondylosis is present.  Low lung volumes observed on the frontal view with suspected subsegmental atelectasis at the lung bases.  IMPRESSION:  1.  Subsegmental atelectasis at the lung bases. 2.  Prominence the ascending aorta may be due to tortuosity or conceivably ascending aortic aneurysm.   Original Report Authenticated By: Gaylyn Rong, M.D.    US Abdomen Complete  01/01/2013  *RADIOLOGY REPORT*  Clinical Data:  79 year old female with abdominal pain.  ABDOMINAL ULTRASOUND COMPLETE  Comparison:  None  Findings:  Gallbladder:  Multiple small mobile gallstones are identified, the largest measuring 8 mm.  Gallbladder wall thickening and small amount of pericholecystic fluid is identified.  These findings likely represent acute cholecystitis.  Common Bile Duct:  There is no evidence of intrahepatic or extrahepatic biliary dilation. The CBD measures 6.0 mm in greatest diameter.  Liver: Mild increased echogenicity of the liver is identified.  No focal abnormalities are identified.  IVC:  Appears normal.  Pancreas:  Although the pancreas is difficult to visualize in its entirety, no focal pancreatic abnormality is identified.  Spleen:  Within normal limits in size and echotexture.  Right kidney:  The right kidney is normal in size and parenchymal echogenicity.  There is no evidence of solid mass, hydronephrosis or  definite renal calculi.  The right kidney measures  10.2 cm.  Left kidney:  The left kidney is normal in size and parenchymal echogenicity.  There is no evidence of solid mass, hydronephrosis or definite renal calculi.   The left kidney measures 9.4 cm.  Abdominal Aorta:  No abdominal aortic aneurysm identified.  There is no evidence of ascites.  IMPRESSION: Cholelithiasis with gallbladder wall thickening and pericholecystic fluid - likely representing acute cholecystitis.  No evidence of biliary dilatation.  Increased hepatic echogenicity - question fatty infiltration.   Original Report Authenticated By: Harmon Pier, M.D.    Orders placed during the hospital encounter of 01/01/13  . EKG 12-LEAD  . EKG 12-LEAD    Assessment/Plan 1. Cholecystitis- doubt malignant process, sxs compatable with this, absence of fever, wbc not necessarily unsusual in older adult. Agree with Dr. Marina Goodell- best approach is Lap Chole and IOC. 2. HTN- stable 3. Mild dementia  Medical issues stable--await surgical decisions.   Minnetta Sandora A 01/01/2013, 5:43 PM

## 2013-01-01 NOTE — Progress Notes (Signed)
ANTIBIOTIC CONSULT NOTE - INITIAL  Pharmacy Consult for Zosyn Indication: cholecystitis  No Known Allergies    Vital Signs: Temp: 98.2 F (36.8 C) (05/10 1800) Temp src: Oral (05/10 1800) BP: 147/54 mmHg (05/10 1800) Pulse Rate: 65 (05/10 1800) Intake/Output from previous day:   Intake/Output from this shift:    Labs:  Recent Labs  01/01/13 1055  WBC 7.4  HGB 13.2  PLT 180  CREATININE 0.82   CrCl is unknown because there is no height on file for the current visit. No results found for this basename: VANCOTROUGH, VANCOPEAK, VANCORANDOM, GENTTROUGH, GENTPEAK, GENTRANDOM, TOBRATROUGH, TOBRAPEAK, TOBRARND, AMIKACINPEAK, AMIKACINTROU, AMIKACIN,  in the last 72 hours   Microbiology: No results found for this or any previous visit (from the past 720 hour(s)).  Medical History: Past Medical History  Diagnosis Date  . Dementia   . Hypertension   . GERD (gastroesophageal reflux disease)     Medications:  Scheduled:  . atenolol  50 mg Oral Daily  . chlorthalidone  25 mg Oral Daily  . cyclobenzaprine  10 mg Oral QHS  . donepezil  10 mg Oral QHS  . enoxaparin (LOVENOX) injection  30 mg Subcutaneous Q24H  . gabapentin  300 mg Oral Daily  . [COMPLETED] ondansetron (ZOFRAN) IV  4 mg Intravenous Once  . pantoprazole (PROTONIX) IV  40 mg Intravenous QHS  . [COMPLETED] piperacillin-tazobactam (ZOSYN)  IV  3.375 g Intravenous Once  . [COMPLETED] potassium chloride  10 mEq Intravenous Once  . [COMPLETED] sodium chloride  500 mL Intravenous Once  . [COMPLETED] sodium chloride  500 mL Intravenous Once  . Vilazodone HCl  40 mg Oral Daily  . [DISCONTINUED] atenolol-chlorthalidone  1 tablet Oral Daily  . [DISCONTINUED] enoxaparin (LOVENOX) injection  30 mg Subcutaneous Q24H   Assessment: 79 yr old female on zosyn for cholelithasis, possible cholecystitis.  Goal of Therapy:  Eradication of infection  Plan:  Start Zosyn 3.375gm IV q8hrs (4 hour infusion). F/u renal function,  clinical status.  Wendie Simmer, PharmD, BCPS Clinical Pharmacist  Pager: [phone]

## 2013-01-01 NOTE — ED Notes (Addendum)
Pt arrives via EMS from home with weakness since Thursday. Pt reports nausea and vomiting, thurs and Friday. None today. Denies pain. Pt with temp at home to 101. Oriented, alert to pts baseline. 18g LAC. CBG 111.

## 2013-01-01 NOTE — ED Provider Notes (Addendum)
History     CSN: [id number]  Arrival date & time 01/01/13  1006   First MD Initiated Contact with Patient 01/01/13 1018      Chief Complaint  Patient presents with  . Weakness  . Emesis    (Consider location/radiation/quality/duration/timing/severity/associated sxs/prior treatment) Patient is a 79 y.o. female presenting with weakness and vomiting. The history is provided by the patient and the spouse.  Weakness Pertinent negatives include no chest pain, no abdominal pain, no headaches and no shortness of breath.  Emesis Associated symptoms: no abdominal pain, no chills and no headaches   pt c/o nv x past couple days and feeling generally weak today. Has vomiting several times, not bloody or bilious. No diarrhea, having normal bms. No abd pain or distension. Denies recent travel, known ill contacts, bad food ingestion, or recent new med use. No dysuria or gu c/o. No fever or chills. Denies cough or uri c/o. No cp or discomfort. No sob or unusual doe. Felt lightheaded when standing today, no syncope. No focal or unilateral numbness/weakness.    Past Medical History  Diagnosis Date  . Dementia   . Hypertension   . GERD (gastroesophageal reflux disease)     History reviewed. No pertinent past surgical history.  History reviewed. No pertinent family history.  History  Substance Use Topics  . Smoking status: Not on file  . Smokeless tobacco: Not on file  . Alcohol Use: Not on file    OB History   Grav Para Term Preterm Abortions TAB SAB Ect Mult Living                  Review of Systems  Constitutional: Negative for fever and chills.  HENT: Negative for neck pain.   Eyes: Negative for redness.  Respiratory: Negative for cough and shortness of breath.   Cardiovascular: Negative for chest pain, palpitations and leg swelling.  Gastrointestinal: Positive for vomiting. Negative for abdominal pain and blood in stool.  Genitourinary: Negative for dysuria and flank pain.   Musculoskeletal: Negative for back pain.  Skin: Negative for rash.  Neurological: Positive for weakness. Negative for syncope, speech difficulty, numbness and headaches.  Hematological: Does not bruise/bleed easily.  Psychiatric/Behavioral: Negative for confusion.    Allergies  Review of patient's allergies indicates no known allergies.  Home Medications  No current outpatient prescriptions on file.  BP 144/60  Pulse 74  Temp(Src) 100.6 F (38.1 C) (Rectal)  Resp 12  SpO2 99%  Physical Exam  Nursing note and vitals reviewed. Constitutional: She is oriented to person, place, and time. She appears well-developed. No distress.  HENT:  Nose: Nose normal.  Mouth/Throat: Oropharynx is clear and moist.  Eyes: Conjunctivae are normal. Pupils are equal, round, and reactive to light. No scleral icterus.  Neck: Normal range of motion. Neck supple. No tracheal deviation present.  No stiffness or rigidity  Cardiovascular: Normal rate, regular rhythm, normal heart sounds and intact distal pulses.  Exam reveals no gallop and no friction rub.   No murmur heard. Pulmonary/Chest: Effort normal and breath sounds normal. No respiratory distress.  Abdominal: Soft. Normal appearance and bowel sounds are normal. She exhibits no distension and no mass. There is no tenderness. There is no rebound and no guarding.  Genitourinary:  No cva tenderness  Musculoskeletal: She exhibits no edema and no tenderness.  Neurological: She is alert and oriented to person, place, and time.  Motor intact bil.   Skin: Skin is warm and dry. No rash noted.  She is not diaphoretic.  Psychiatric: She has a normal mood and affect.    ED Course  Procedures (including critical care time)   Results for orders placed during the hospital encounter of 01/01/13  CBC      Result Value Range   WBC 7.4  4.0 - 10.5 K/uL   RBC 4.28  3.87 - 5.11 MIL/uL   Hemoglobin 13.2  12.0 - 15.0 g/dL   HCT 16.1  09.6 - 04.5 %   MCV 89.5   78.0 - 100.0 fL   MCH 30.8  26.0 - 34.0 pg   MCHC 34.5  30.0 - 36.0 g/dL   RDW 40.9  81.1 - 91.4 %   Platelets 180  150 - 400 K/uL  COMPREHENSIVE METABOLIC PANEL      Result Value Range   Sodium 135  135 - 145 mEq/L   Potassium 3.0 (*) 3.5 - 5.1 mEq/L   Chloride 97  96 - 112 mEq/L   CO2 27  19 - 32 mEq/L   Glucose, Bld 110 (*) 70 - 99 mg/dL   BUN 10  6 - 23 mg/dL   Creatinine, Ser 7.82  0.50 - 1.10 mg/dL   Calcium 9.1  8.4 - 95.6 mg/dL   Total Protein 6.5  6.0 - 8.3 g/dL   Albumin 3.2 (*) 3.5 - 5.2 g/dL   AST 213 (*) 0 - 37 U/L   ALT 215 (*) 0 - 35 U/L   Alkaline Phosphatase 112  39 - 117 U/L   Total Bilirubin 5.2 (*) 0.3 - 1.2 mg/dL   GFR calc non Af Amer 67 (*) >90 mL/min   GFR calc Af Amer 77 (*) >90 mL/min  TROPONIN I      Result Value Range   Troponin I <0.30  <0.30 ng/mL  LIPASE, BLOOD      Result Value Range   Lipase 18  11 - 59 U/L   Dg Chest 2 View  01/01/2013  *RADIOLOGY REPORT*  Clinical Data: Weakness. Shortness of breath.  CHEST - 2 VIEW  Comparison: 06/28/2007  Findings: Tortuous thoracic aorta noted with prominence of the ascending thoracic aorta.  No cardiomegaly.  No pleural effusion. Mild thoracic spondylosis is present.  Low lung volumes observed on the frontal view with suspected subsegmental atelectasis at the lung bases.  IMPRESSION:  1.  Subsegmental atelectasis at the lung bases. 2.  Prominence the ascending aorta may be due to tortuosity or conceivably ascending aortic aneurysm.   Original Report Authenticated By: Gaylyn Rong, M.D.    US Abdomen Complete  01/01/2013  *RADIOLOGY REPORT*  Clinical Data:  79 year old female with abdominal pain.  ABDOMINAL ULTRASOUND COMPLETE  Comparison:  None  Findings:  Gallbladder:  Multiple small mobile gallstones are identified, the largest measuring 8 mm.  Gallbladder wall thickening and small amount of pericholecystic fluid is identified.  These findings likely represent acute cholecystitis.  Common Bile Duct:   There is no evidence of intrahepatic or extrahepatic biliary dilation. The CBD measures 6.0 mm in greatest diameter.  Liver: Mild increased echogenicity of the liver is identified.  No focal abnormalities are identified.  IVC:  Appears normal.  Pancreas:  Although the pancreas is difficult to visualize in its entirety, no focal pancreatic abnormality is identified.  Spleen:  Within normal limits in size and echotexture.  Right kidney:  The right kidney is normal in size and parenchymal echogenicity.  There is no evidence of solid mass, hydronephrosis or definite renal calculi.  The right kidney measures  10.2 cm.  Left kidney:  The left kidney is normal in size and parenchymal echogenicity.  There is no evidence of solid mass, hydronephrosis or definite renal calculi.   The left kidney measures 9.4 cm.  Abdominal Aorta:  No abdominal aortic aneurysm identified.  There is no evidence of ascites.  IMPRESSION: Cholelithiasis with gallbladder wall thickening and pericholecystic fluid - likely representing acute cholecystitis.  No evidence of biliary dilatation.  Increased hepatic echogenicity - question fatty infiltration.   Original Report Authenticated By: Harmon Pier, M.D.        MDM  Iv ns bolus. zofran iv. Labs.  Reviewed nursing notes and prior charts for additional history.    Date: 01/01/2013  Rate: 73  Rhythm: normal sinus rhythm  QRS Axis: left  Intervals: normal  ST/T Wave abnormalities: nonspecific ST/T changes  Conduction Disutrbances:lvh  Narrative Interpretation:   Old EKG Reviewed: unchanged  U/s results noted c/w cholecystitis.   Zosyn iv. gen surgery paged.  Recheck pt comfortable, denies pain or nv.  Discussed pt with gen surgery on call, Dr Magnus Ivan, he has reviewed labs and u/s, he indicates as bil elev, ast/alt elev, to call gi to see and admit to med service, he will consult.   Discussed pt with Gi on call, Dr Marina Goodell, who indicates although bil elev, as no signif bil dil or  visible cbd stone on u/s and alk phos normal, that likely just needs admit for iv abx and surgery, does not feel pt would benefit from ercp, requests medicine service be called.  Pts internal med/pcp called.       Suzi Roots, MD 01/01/13 [phone]

## 2013-01-01 NOTE — ED Notes (Signed)
Pt transported to US

## 2013-01-02 ENCOUNTER — Inpatient Hospital Stay (HOSPITAL_COMMUNITY): Payer: Medicare Other

## 2013-01-02 LAB — COMPREHENSIVE METABOLIC PANEL
ALT: 137 U/L — ABNORMAL HIGH (ref 0–35)
ALT: 141 U/L — ABNORMAL HIGH (ref 0–35)
AST: 114 U/L — ABNORMAL HIGH (ref 0–37)
AST: 117 U/L — ABNORMAL HIGH (ref 0–37)
Alkaline Phosphatase: 98 U/L (ref 39–117)
CO2: 24 mEq/L (ref 19–32)
Calcium: 8.3 mg/dL — ABNORMAL LOW (ref 8.4–10.5)
Calcium: 8.5 mg/dL (ref 8.4–10.5)
Chloride: 104 mEq/L (ref 96–112)
GFR calc Af Amer: 77 mL/min — ABNORMAL LOW (ref >90)
GFR calc non Af Amer: 67 mL/min — ABNORMAL LOW (ref >90)
Glucose, Bld: 75 mg/dL (ref 70–99)
Sodium: 139 mEq/L (ref 135–145)
Sodium: 139 mEq/L (ref 135–145)
Total Bilirubin: 3.1 mg/dL — ABNORMAL HIGH (ref 0.3–1.2)
Total Protein: 5.6 g/dL — ABNORMAL LOW (ref 6.0–8.3)

## 2013-01-02 LAB — CBC
HCT: 35.3 % — ABNORMAL LOW (ref 36.0–46.0)
Hemoglobin: 11.8 g/dL — ABNORMAL LOW (ref 12.0–15.0)
Hemoglobin: 12.3 g/dL (ref 12.0–15.0)
MCH: 30.6 pg (ref 26.0–34.0)
MCH: 30.8 pg (ref 26.0–34.0)
MCHC: 33.4 g/dL (ref 30.0–36.0)
MCHC: 33.5 g/dL (ref 30.0–36.0)
Platelets: 168 10*3/uL (ref 150–400)
RBC: 3.85 MIL/uL — ABNORMAL LOW (ref 3.87–5.11)
RDW: 14.5 % (ref 11.5–15.5)

## 2013-01-02 MED ORDER — GADOBENATE DIMEGLUMINE 529 MG/ML IV SOLN: 15.0000 mL | Freq: Once | INTRAVENOUS | Status: DC

## 2013-01-02 NOTE — Progress Notes (Signed)
PT Cancellation Note  Patient Details Name: Pamela Cummings MRN: [id number] DOB: [birth year]   Cancelled Treatment:     Currently at MRI; Will reattempt PT later today or tomorrow as able;   Van Clines [facility] 01/02/2013, 3:38 PM

## 2013-01-02 NOTE — Progress Notes (Signed)
Subjective: Patient is doing well. No pain no nausea vomiting. Await decisions regarding cholecystectomy  Objective: Vital signs in last 24 hours: Temp:  [97.8 F (36.6 C)-100.6 F (38.1 C)] 98.4 F (36.9 C) (05/11 0619) Pulse Rate:  [57-76] 57 (05/11 0619) Resp:  [12-20] 18 (05/11 0619) BP: (123-167)/(49-103) 142/49 mmHg (05/11 0619) SpO2:  [90 %-99 %] 94 % (05/11 0619) Weight:  [80.9 kg (178 lb 5.6 oz)] 80.9 kg (178 lb 5.6 oz) (05/10 1800) Weight change:   CBG (last 3)  No results found for this basename: GLUCAP,  in the last 72 hours  Intake/Output from previous day:    Physical Exam: Awake alert no distress. Good facial symmetry. No JVD or bruits. Lungs are clear. Cardiovascular exam regular rate and rhythm no murmur. Abdomen is soft and nontender. No peripheral edema calves soft and nontender.   Lab Results:  Recent Labs  01/02/13 0555 01/02/13 0752  NA 139 139  K 3.3* 3.4*  CL 104 104  CO2 24 24  GLUCOSE 75 75  BUN 7 7  CREATININE 0.87 0.82  CALCIUM 8.3* 8.5    Recent Labs  01/02/13 0555 01/02/13 0752  AST 114* 117*  ALT 137* 141*  ALKPHOS 96 98  BILITOT 3.0* 3.1*  PROT 5.6* 6.0  ALBUMIN 2.6* 2.7*    Recent Labs  01/02/13 0555 01/02/13 0752  WBC 5.4 4.9  HGB 11.8* 12.3  HCT 35.3* 36.7  MCV 91.7 92.0  PLT 155 168   Lab Results  Component Value Date   INR 0.9 06/28/2007    Recent Labs  01/01/13 1055  TROPONINI <0.30   No results found for this basename: TSH, T4TOTAL, FREET3, T3FREE, THYROIDAB,  in the last 72 hours No results found for this basename: VITAMINB12, FOLATE, FERRITIN, TIBC, IRON, RETICCTPCT,  in the last 72 hours  Studies/Results: Dg Chest 2 View  01/01/2013  *RADIOLOGY REPORT*  Clinical Data: Weakness. Shortness of breath.  CHEST - 2 VIEW  Comparison: 06/28/2007  Findings: Tortuous thoracic aorta noted with prominence of the ascending thoracic aorta.  No cardiomegaly.  No pleural effusion. Mild thoracic spondylosis is  present.  Low lung volumes observed on the frontal view with suspected subsegmental atelectasis at the lung bases.  IMPRESSION:  1.  Subsegmental atelectasis at the lung bases. 2.  Prominence the ascending aorta may be due to tortuosity or conceivably ascending aortic aneurysm.   Original Report Authenticated By: Gaylyn Rong, M.D.    US Abdomen Complete  01/01/2013  *RADIOLOGY REPORT*  Clinical Data:  79 year old female with abdominal pain.  ABDOMINAL ULTRASOUND COMPLETE  Comparison:  None  Findings:  Gallbladder:  Multiple small mobile gallstones are identified, the largest measuring 8 mm.  Gallbladder wall thickening and small amount of pericholecystic fluid is identified.  These findings likely represent acute cholecystitis.  Common Bile Duct:  There is no evidence of intrahepatic or extrahepatic biliary dilation. The CBD measures 6.0 mm in greatest diameter.  Liver: Mild increased echogenicity of the liver is identified.  No focal abnormalities are identified.  IVC:  Appears normal.  Pancreas:  Although the pancreas is difficult to visualize in its entirety, no focal pancreatic abnormality is identified.  Spleen:  Within normal limits in size and echotexture.  Right kidney:  The right kidney is normal in size and parenchymal echogenicity.  There is no evidence of solid mass, hydronephrosis or definite renal calculi.  The right kidney measures  10.2 cm.  Left kidney:  The left kidney is normal  in size and parenchymal echogenicity.  There is no evidence of solid mass, hydronephrosis or definite renal calculi.   The left kidney measures 9.4 cm.  Abdominal Aorta:  No abdominal aortic aneurysm identified.  There is no evidence of ascites.  IMPRESSION: Cholelithiasis with gallbladder wall thickening and pericholecystic fluid - likely representing acute cholecystitis.  No evidence of biliary dilatation.  Increased hepatic echogenicity - question fatty infiltration.   Original Report Authenticated By: Harmon Pier, M.D.      Assessment/Plan: #1 probable cholecystitis decisions regarding timing of cholecystectomy per general surgery  #2 essential hypertension stable  #3 mild dementia stable  Medically she stable at this time will follow for support   LOS: 1 day   Allysa Governale A 01/02/2013, 9:51 AM

## 2013-01-02 NOTE — Progress Notes (Signed)
Subjective: Pt feels fine.  She denies pain, but has significant pain with palpation.  Pt denies N/V.  Ambulating some, but feels weak (originally fell which is why her husband brought her to the ER).    Objective: Vital signs in last 24 hours: Temp:  [97.8 F (36.6 C)-100.6 F (38.1 C)] 98.4 F (36.9 C) (05/11 0619) Pulse Rate:  [57-76] 57 (05/11 0619) Resp:  [12-20] 18 (05/11 0619) BP: (123-167)/(49-103) 142/49 mmHg (05/11 0619) SpO2:  [90 %-99 %] 94 % (05/11 0619) Weight:  [178 lb 5.6 oz (80.9 kg)] 178 lb 5.6 oz (80.9 kg) (05/10 1800) Last BM Date: 12/31/12  Intake/Output from previous day:   Intake/Output this shift:    PE: Gen:  Alert, NAD, pleasant Abd: Soft, moderately tender in RUQ and mildly in RLQ, ND, +BS, no HSM, no abdominal scars noted   Lab Results:   Recent Labs  01/01/13 1055 01/01/13 1854  WBC 7.4 5.5  HGB 13.2 12.6  HCT 38.3 37.4  PLT 180 156   BMET  Recent Labs  01/01/13 1055 01/01/13 1854  NA 135  --   K 3.0*  --   CL 97  --   CO2 27  --   GLUCOSE 110*  --   BUN 10  --   CREATININE 0.82 0.91  CALCIUM 9.1  --    PT/INR No results found for this basename: LABPROT, INR,  in the last 72 hours CMP     Component Value Date/Time   NA 135 01/01/2013 1055   K 3.0* 01/01/2013 1055   CL 97 01/01/2013 1055   CO2 27 01/01/2013 1055   GLUCOSE 110* 01/01/2013 1055   BUN 10 01/01/2013 1055   CREATININE 0.91 01/01/2013 1854   CALCIUM 9.1 01/01/2013 1055   PROT 6.5 01/01/2013 1055   ALBUMIN 3.2* 01/01/2013 1055   AST 231* 01/01/2013 1055   ALT 215* 01/01/2013 1055   ALKPHOS 112 01/01/2013 1055   BILITOT 5.2* 01/01/2013 1055   GFRNONAA 59* 01/01/2013 1854   GFRAA 68* 01/01/2013 1854   Lipase     Component Value Date/Time   LIPASE 18 01/01/2013 1154       Studies/Results: Dg Chest 2 View  01/01/2013  *RADIOLOGY REPORT*  Clinical Data: Weakness. Shortness of breath.  CHEST - 2 VIEW  Comparison: 06/28/2007  Findings: Tortuous thoracic aorta noted  with prominence of the ascending thoracic aorta.  No cardiomegaly.  No pleural effusion. Mild thoracic spondylosis is present.  Low lung volumes observed on the frontal view with suspected subsegmental atelectasis at the lung bases.  IMPRESSION:  1.  Subsegmental atelectasis at the lung bases. 2.  Prominence the ascending aorta may be due to tortuosity or conceivably ascending aortic aneurysm.   Original Report Authenticated By: Gaylyn Rong, M.D.    US Abdomen Complete  01/01/2013  *RADIOLOGY REPORT*  Clinical Data:  79 year old female with abdominal pain.  ABDOMINAL ULTRASOUND COMPLETE  Comparison:  None  Findings:  Gallbladder:  Multiple small mobile gallstones are identified, the largest measuring 8 mm.  Gallbladder wall thickening and small amount of pericholecystic fluid is identified.  These findings likely represent acute cholecystitis.  Common Bile Duct:  There is no evidence of intrahepatic or extrahepatic biliary dilation. The CBD measures 6.0 mm in greatest diameter.  Liver: Mild increased echogenicity of the liver is identified.  No focal abnormalities are identified.  IVC:  Appears normal.  Pancreas:  Although the pancreas is difficult to visualize in its entirety, no  focal pancreatic abnormality is identified.  Spleen:  Within normal limits in size and echotexture.  Right kidney:  The right kidney is normal in size and parenchymal echogenicity.  There is no evidence of solid mass, hydronephrosis or definite renal calculi.  The right kidney measures  10.2 cm.  Left kidney:  The left kidney is normal in size and parenchymal echogenicity.  There is no evidence of solid mass, hydronephrosis or definite renal calculi.   The left kidney measures 9.4 cm.  Abdominal Aorta:  No abdominal aortic aneurysm identified.  There is no evidence of ascites.  IMPRESSION: Cholelithiasis with gallbladder wall thickening and pericholecystic fluid - likely representing acute cholecystitis.  No evidence of biliary  dilatation.  Increased hepatic echogenicity - question fatty infiltration.   Original Report Authenticated By: Harmon Pier, M.D.     Anti-infectives: Anti-infectives   Start     Dose/Rate Route Frequency Ordered Stop   01/01/13 2200  piperacillin-tazobactam (ZOSYN) IVPB 3.375 g     3.375 g 12.5 mL/hr over 240 Minutes Intravenous 3 times per day 01/01/13 1817     01/01/13 1345  piperacillin-tazobactam (ZOSYN) IVPB 3.375 g     3.375 g 12.5 mL/hr over 240 Minutes Intravenous  Once 01/01/13 1339 01/01/13 1745       Assessment/Plan Cholelithiasis, possible cholecystitis, possible common bile duct stone 1.  NPO, IVF, pain control, antiemetics 2.  Pending repeat labs for this am 3.  Will determine plan after labs and MRCP are back 4.  May be able to go to OR today or tomorrow as long as CBD clear  Hyperbilirubinemia - concern for CBD stone 1.  MRCP to r/u choledocholithiasis    LOS: 1 day    DORT, Arabel Barcenas 01/02/2013, 7:29 AM Pager: [phone]

## 2013-01-02 NOTE — Progress Notes (Addendum)
Patient ID: Pamela Cummings, female   DOB: November 02, 1931, 79 y.o.   MRN: [id number] [facility] Progress Note  Subjective: Feels pretty good,sitting up in chair-denies pain or nausea Scheduled for MRCP later today  Objective:  Vital signs in last 24 hours: Temp:  [97.8 F (36.6 C)-98.7 F (37.1 C)] 98.4 F (36.9 C) (05/11 1032) Pulse Rate:  [57-77] 77 (05/11 1032) Resp:  [15-18] 18 (05/11 1032) BP: (123-167)/(43-103) 123/43 mmHg (05/11 1032) SpO2:  [93 %-100 %] 100 % (05/11 1032) Weight:  [178 lb 5.6 oz (80.9 kg)] 178 lb 5.6 oz (80.9 kg) (05/10 1800) Last BM Date: 12/31/12 General:   Alert,  Well-developed, elderly WF    in NAD Heart:  Regular rate and rhythm; no murmurs Pulm;clear Abdomen:  Soft, minimally tender and nondistended. Normal bowel sounds, without guarding, and without rebound.   Extremities:  Without edema. Neurologic:  Alert and  oriented x4;  grossly normal neurologically. Psych:  Alert and cooperative. Normal mood and affect.  Intake/Output from previous day:   Intake/Output this shift:    Lab Results:  Recent Labs  01/01/13 1854 01/02/13 0555 01/02/13 0752  WBC 5.5 5.4 4.9  HGB 12.6 11.8* 12.3  HCT 37.4 35.3* 36.7  PLT 156 155 168   BMET  Recent Labs  01/01/13 1055 01/01/13 1854 01/02/13 0555 01/02/13 0752  NA 135  --  139 139  K 3.0*  --  3.3* 3.4*  CL 97  --  104 104  CO2 27  --  24 24  GLUCOSE 110*  --  75 75  BUN 10  --  7 7  CREATININE 0.82 0.91 0.87 0.82  CALCIUM 9.1  --  8.3* 8.5   LFT  Recent Labs  01/02/13 0752  PROT 6.0  ALBUMIN 2.7*  AST 117*  ALT 141*  ALKPHOS 98  BILITOT 3.1*   PT/INR No results found for this basename: LABPROT, INR,  in the last 72 hours Hepatitis Panel No results found for this basename: HEPBSAG, HCVAB, HEPAIGM, HEPBIGM,  in the last 72 hours  Assessment / Plan: #1 79 yo female with acute cholecystitis/cholelithiasis and elevated LFT's with normal alk phos-  For MRCP today to  r/o CBD stones-if negative then cholecysectomy  Within next 24 hours Will follow up MRCP Active Problems:   * No active hospital problems. *     LOS: 1 day   Amy Esterwood  01/02/2013, 10:55 AM   GI ATTENDING  Patient seen and examined. Agree with interval history, exam, and laboratories as outlined above. Patient is now undergoing MRCP. Await results. If no surprises, anticipate laparoscopic cholecystectomy with IOC. Discussed with husband this afternoon. Thank you  Wilhemina Bonito. Eda Keys., M.D. [facility] Division of Gastroenterology

## 2013-01-02 NOTE — Progress Notes (Signed)
The MRCP supports acute calculus cholecystitis without choledocholithiasis. Anticipate cholecystectomy. Please call for assistance if needed. Will sign off. Thank you  Wilhemina Bonito. Eda Keys., M.D. [facility] Division of Gastroenterology

## 2013-01-02 NOTE — Progress Notes (Signed)
General surgery attending note:   I have personally interviewed and examined this patient this morning. I agree with the assessment and treatment plan outlined by Ms. Dortch, PA as well as the assessment and treatment plan outlined by Dr. Rayburn Ma.  She is in no distress this morning.Afebrile. Heart rate 83. WBC normal. LFTs pending Abdomen soft. She is tender to deep palpation in the right upper quadrant and epigastrium, but no guarding or peritonitis. No mass.  Assessment and plan:  Cholelithiasis and probable cholecystitis. Continue IV antibiotics.  Jaundice. I agree that normal CBD and normal alkaline phosphatase suggests hepatocellular dysfunction from acute inflammation rather than obstruction.Marland Kitchen Absence of significant pain or leukocytosis is a little inconsistent with this, however.  Doubt neoplasia.  Agree with preop MRCP. If this looks okay we will proceed with a cholecystectomy today, or more likely tomorrow.   Angelia Mould. Derrell Lolling, M.D., [facility] General and Minimally invasive Surgery Breast and Colorectal Surgery Office:   [phone] Pager:   [phone]

## 2013-01-03 ENCOUNTER — Inpatient Hospital Stay (HOSPITAL_COMMUNITY): Payer: Medicare Other

## 2013-01-03 ENCOUNTER — Encounter (HOSPITAL_COMMUNITY): Payer: Self-pay | Admitting: Certified Registered Nurse Anesthetist

## 2013-01-03 ENCOUNTER — Encounter (HOSPITAL_COMMUNITY): Admission: EM | Disposition: A | Discharge status: Skilled Nursing Facility | Payer: Self-pay | Source: Home / Self Care

## 2013-01-03 ENCOUNTER — Inpatient Hospital Stay (HOSPITAL_COMMUNITY): Payer: Medicare Other | Admitting: Certified Registered Nurse Anesthetist

## 2013-01-03 HISTORY — PX: CHOLECYSTECTOMY: SHX55

## 2013-01-03 LAB — CBC
Platelets: 167 10*3/uL (ref 150–400)
RBC: 4.02 MIL/uL (ref 3.87–5.11)
RDW: 14.2 % (ref 11.5–15.5)
WBC: 5 10*3/uL (ref 4.0–10.5)

## 2013-01-03 LAB — SURGICAL PCR SCREEN: Staphylococcus aureus: NEGATIVE

## 2013-01-03 LAB — COMPREHENSIVE METABOLIC PANEL
ALT: 108 U/L — ABNORMAL HIGH (ref 0–35)
AST: 72 U/L — ABNORMAL HIGH (ref 0–37)
Albumin: 2.8 g/dL — ABNORMAL LOW (ref 3.5–5.2)
CO2: 20 mEq/L (ref 19–32)
Chloride: 104 mEq/L (ref 96–112)
GFR calc non Af Amer: 78 mL/min — ABNORMAL LOW (ref >90)
Potassium: 3.9 mEq/L (ref 3.5–5.1)
Sodium: 139 mEq/L (ref 135–145)
Total Bilirubin: 2.2 mg/dL — ABNORMAL HIGH (ref 0.3–1.2)

## 2013-01-03 SURGERY — LAPAROSCOPIC CHOLECYSTECTOMY WITH INTRAOPERATIVE CHOLANGIOGRAM
Anesthesia: General | Site: Abdomen | Wound class: Clean Contaminated

## 2013-01-03 MED ORDER — GLYCOPYRROLATE 0.2 MG/ML IJ SOLN
INTRAMUSCULAR | Status: DC | PRN
  Administered 2013-01-03: .4 mg via INTRAVENOUS
  Administered 2013-01-03: 0.6 mg via INTRAVENOUS

## 2013-01-03 MED ORDER — ENOXAPARIN SODIUM 40 MG/0.4ML ~~LOC~~ SOLN: 40.0000 mg | SUBCUTANEOUS | Status: DC

## 2013-01-03 MED ORDER — LIDOCAINE HCL 4 % MT SOLN
OROMUCOSAL | Status: DC | PRN
  Administered 2013-01-03: 4 mL via TOPICAL

## 2013-01-03 MED ORDER — SODIUM CHLORIDE 0.9 % IV SOLN
INTRAVENOUS | Status: DC | PRN
  Administered 2013-01-03: 10:00:00

## 2013-01-03 MED ORDER — DEXTROSE 50 % IV SOLN
INTRAVENOUS | Status: AC
  Administered 2013-01-03: 25 mL
  Filled 2013-01-03: qty 50

## 2013-01-03 MED ORDER — ARTIFICIAL TEARS OP OINT
TOPICAL_OINTMENT | OPHTHALMIC | Status: DC | PRN
  Administered 2013-01-03: 1 via OPHTHALMIC

## 2013-01-03 MED ORDER — OXYCODONE HCL 5 MG PO TABS
5.0000 mg | ORAL_TABLET | ORAL | Status: DC | PRN
  Administered 2013-01-03: 5 mg via ORAL
  Filled 2013-01-03: qty 1

## 2013-01-03 MED ORDER — LACTATED RINGERS IV SOLN
INTRAVENOUS | Status: DC | PRN
  Administered 2013-01-03 (×2): via INTRAVENOUS

## 2013-01-03 MED ORDER — LABETALOL HCL 5 MG/ML IV SOLN
INTRAVENOUS | Status: DC | PRN
  Administered 2013-01-03: 5 mg via INTRAVENOUS
  Administered 2013-01-03: 10 mg via INTRAVENOUS
  Administered 2013-01-03: 5 mg via INTRAVENOUS

## 2013-01-03 MED ORDER — SODIUM CHLORIDE 0.9 % IR SOLN
Status: DC | PRN
  Administered 2013-01-03: 1000 mL

## 2013-01-03 MED ORDER — HYDROMORPHONE HCL PF 1 MG/ML IJ SOLN: 0.2500 mg | INTRAMUSCULAR | Status: DC | PRN

## 2013-01-03 MED ORDER — ONDANSETRON HCL 4 MG/2ML IJ SOLN
INTRAMUSCULAR | Status: DC | PRN
  Administered 2013-01-03: 4 mg via INTRAVENOUS

## 2013-01-03 MED ORDER — BUPIVACAINE-EPINEPHRINE 0.25% -1:200000 IJ SOLN
INTRAMUSCULAR | Status: DC | PRN
  Administered 2013-01-03: 12 mL

## 2013-01-03 MED ORDER — ENOXAPARIN SODIUM 40 MG/0.4ML ~~LOC~~ SOLN
40.0000 mg | SUBCUTANEOUS | Status: DC
  Administered 2013-01-04 (×2): 40 mg via SUBCUTANEOUS
  Filled 2013-01-03 (×3): qty 0.4

## 2013-01-03 MED ORDER — NEOSTIGMINE METHYLSULFATE 1 MG/ML IJ SOLN
INTRAMUSCULAR | Status: DC | PRN
  Administered 2013-01-03: 4 mg via INTRAVENOUS

## 2013-01-03 MED ORDER — ONDANSETRON HCL 4 MG/2ML IJ SOLN: 4.0000 mg | Freq: Once | INTRAMUSCULAR | Status: DC | PRN

## 2013-01-03 MED ORDER — LIDOCAINE HCL (CARDIAC) 20 MG/ML IV SOLN
INTRAVENOUS | Status: DC | PRN
  Administered 2013-01-03: 100 mg via INTRAVENOUS

## 2013-01-03 MED ORDER — MORPHINE SULFATE 2 MG/ML IJ SOLN
2.0000 mg | INTRAMUSCULAR | Status: DC | PRN
  Administered 2013-01-03 (×2): 2 mg via INTRAVENOUS

## 2013-01-03 MED ORDER — FENTANYL CITRATE 0.05 MG/ML IJ SOLN
INTRAMUSCULAR | Status: DC | PRN
  Administered 2013-01-03: 100 ug via INTRAVENOUS
  Administered 2013-01-03: 50 ug via INTRAVENOUS
  Administered 2013-01-03 (×2): 100 ug via INTRAVENOUS

## 2013-01-03 MED ORDER — ONDANSETRON HCL 4 MG PO TABS
4.0000 mg | ORAL_TABLET | Freq: Once | ORAL | Status: AC
  Administered 2013-01-03: 4 mg via ORAL
  Filled 2013-01-03: qty 1

## 2013-01-03 MED ORDER — ROCURONIUM BROMIDE 100 MG/10ML IV SOLN
INTRAVENOUS | Status: DC | PRN
  Administered 2013-01-03: 10 mg via INTRAVENOUS
  Administered 2013-01-03 (×2): 20 mg via INTRAVENOUS

## 2013-01-03 MED ORDER — PROPOFOL 10 MG/ML IV BOLUS
INTRAVENOUS | Status: DC | PRN
  Administered 2013-01-03: 120 mg via INTRAVENOUS

## 2013-01-03 MED ORDER — 0.9 % SODIUM CHLORIDE (POUR BTL) OPTIME
TOPICAL | Status: DC | PRN
  Administered 2013-01-03: 1000 mL

## 2013-01-03 SURGICAL SUPPLY — 55 items
ADH SKN CLS APL DERMABOND .7 (GAUZE/BANDAGES/DRESSINGS) ×1
APPLIER CLIP 5 13 M/L LIGAMAX5 (MISCELLANEOUS) ×2
APPLIER CLIP ROT 10 11.4 M/L (STAPLE)
APR CLP MED LRG 11.4X10 (STAPLE)
APR CLP MED LRG 5 ANG JAW (MISCELLANEOUS) ×1
BAG SPEC RTRVL LRG 6X4 10 (ENDOMECHANICALS) ×1
BLADE SURG ROTATE 9660 (MISCELLANEOUS) IMPLANT
CANISTER SUCTION 2500CC (MISCELLANEOUS) ×2 IMPLANT
CHLORAPREP W/TINT 26ML (MISCELLANEOUS) ×2 IMPLANT
CLIP APPLIE 5 13 M/L LIGAMAX5 (MISCELLANEOUS) ×1 IMPLANT
CLIP APPLIE ROT 10 11.4 M/L (STAPLE) IMPLANT
CLOTH BEACON ORANGE TIMEOUT ST (SAFETY) ×2 IMPLANT
COVER MAYO STAND STRL (DRAPES) ×2 IMPLANT
COVER SURGICAL LIGHT HANDLE (MISCELLANEOUS) ×2 IMPLANT
DECANTER SPIKE VIAL GLASS SM (MISCELLANEOUS) ×4 IMPLANT
DERMABOND ADVANCED (GAUZE/BANDAGES/DRESSINGS) ×1
DERMABOND ADVANCED .7 DNX12 (GAUZE/BANDAGES/DRESSINGS) ×1 IMPLANT
DRAPE C-ARM 42X72 X-RAY (DRAPES) ×2 IMPLANT
DRAPE UTILITY 15X26 W/TAPE STR (DRAPE) ×4 IMPLANT
ELECT REM PT RETURN 9FT ADLT (ELECTROSURGICAL) ×2
ELECTRODE REM PT RTRN 9FT ADLT (ELECTROSURGICAL) ×1 IMPLANT
FILTER SMOKE EVAC LAPAROSHD (FILTER) IMPLANT
GLOVE BIO SURGEON STRL SZ 6 (GLOVE) ×2 IMPLANT
GLOVE BIO SURGEON STRL SZ 6.5 (GLOVE) ×2 IMPLANT
GLOVE BIO SURGEON STRL SZ7 (GLOVE) ×2 IMPLANT
GLOVE BIO SURGEON STRL SZ7.5 (GLOVE) ×2 IMPLANT
GLOVE BIO SURGEON STRL SZ8 (GLOVE) ×2 IMPLANT
GLOVE BIOGEL PI IND STRL 6 (GLOVE) ×1 IMPLANT
GLOVE BIOGEL PI IND STRL 7.0 (GLOVE) ×2 IMPLANT
GLOVE BIOGEL PI IND STRL 7.5 (GLOVE) ×1 IMPLANT
GLOVE BIOGEL PI IND STRL 8 (GLOVE) ×1 IMPLANT
GLOVE BIOGEL PI INDICATOR 6 (GLOVE) ×1
GLOVE BIOGEL PI INDICATOR 7.0 (GLOVE) ×2
GLOVE BIOGEL PI INDICATOR 7.5 (GLOVE) ×1
GLOVE BIOGEL PI INDICATOR 8 (GLOVE) ×1
GLOVE ECLIPSE 7.5 STRL STRAW (GLOVE) ×4 IMPLANT
GOWN PREVENTION PLUS XLARGE (GOWN DISPOSABLE) ×2 IMPLANT
GOWN STRL NON-REIN LRG LVL3 (GOWN DISPOSABLE) ×8 IMPLANT
KIT BASIN OR (CUSTOM PROCEDURE TRAY) ×2 IMPLANT
KIT ROOM TURNOVER OR (KITS) ×2 IMPLANT
NS IRRIG 1000ML POUR BTL (IV SOLUTION) ×2 IMPLANT
PAD ARMBOARD 7.5X6 YLW CONV (MISCELLANEOUS) ×2 IMPLANT
POUCH SPECIMEN RETRIEVAL 10MM (ENDOMECHANICALS) ×2 IMPLANT
SCISSORS LAP 5X35 DISP (ENDOMECHANICALS) IMPLANT
SET CHOLANGIOGRAPH 5 50 .035 (SET/KITS/TRAYS/PACK) ×2 IMPLANT
SET IRRIG TUBING LAPAROSCOPIC (IRRIGATION / IRRIGATOR) ×2 IMPLANT
SLEEVE ENDOPATH XCEL 5M (ENDOMECHANICALS) ×4 IMPLANT
SPECIMEN JAR SMALL (MISCELLANEOUS) ×2 IMPLANT
SUT VIC AB 4-0 PS2 27 (SUTURE) ×2 IMPLANT
TOWEL OR 17X24 6PK STRL BLUE (TOWEL DISPOSABLE) ×2 IMPLANT
TOWEL OR 17X26 10 PK STRL BLUE (TOWEL DISPOSABLE) ×2 IMPLANT
TRAY LAPAROSCOPIC (CUSTOM PROCEDURE TRAY) ×2 IMPLANT
TROCAR XCEL BLUNT TIP 100MML (ENDOMECHANICALS) ×2 IMPLANT
TROCAR XCEL NON-BLD 5MMX100MML (ENDOMECHANICALS) ×2 IMPLANT
WATER STERILE IRR 1000ML POUR (IV SOLUTION) IMPLANT

## 2013-01-03 NOTE — Progress Notes (Signed)
Subjective: No RUQ pain  Objective: Vital signs in last 24 hours: Temp:  [97.9 F (36.6 C)-98.4 F (36.9 C)] 97.9 F (36.6 C) (05/12 0453) Pulse Rate:  [57-77] 57 (05/12 0453) Resp:  [18] 18 (05/12 0453) BP: (123-171)/(43-62) 167/62 mmHg (05/12 0453) SpO2:  [94 %-100 %] 94 % (05/12 0453) Last BM Date: 12/31/12  Intake/Output from previous day:   Intake/Output this shift:    General appearance: alert and cooperative Resp: clear to auscultation bilaterally Cardio: S1, S2 normal GI: soft, no RUQ tenderness, no masses Extremities: PAS BLE  Lab Results:   Recent Labs  01/02/13 0752 01/03/13 0624  WBC 4.9 5.0  HGB 12.3 12.3  HCT 36.7 36.9  PLT 168 167   BMET  Recent Labs  01/02/13 0555 01/02/13 0752  NA 139 139  K 3.3* 3.4*  CL 104 104  CO2 24 24  GLUCOSE 75 75  BUN 7 7  CREATININE 0.87 0.82  CALCIUM 8.3* 8.5   PT/INR No results found for this basename: LABPROT, INR,  in the last 72 hours ABG No results found for this basename: PHART, PCO2, PO2, HCO3,  in the last 72 hours  Studies/Results: Dg Chest 2 View  01/01/2013  *RADIOLOGY REPORT*  Clinical Data: Weakness. Shortness of breath.  CHEST - 2 VIEW  Comparison: 06/28/2007  Findings: Tortuous thoracic aorta noted with prominence of the ascending thoracic aorta.  No cardiomegaly.  No pleural effusion. Mild thoracic spondylosis is present.  Low lung volumes observed on the frontal view with suspected subsegmental atelectasis at the lung bases.  IMPRESSION:  1.  Subsegmental atelectasis at the lung bases. 2.  Prominence the ascending aorta may be due to tortuosity or conceivably ascending aortic aneurysm.   Original Report Authenticated By: Gaylyn Rong, M.D.    US Abdomen Complete  01/01/2013  *RADIOLOGY REPORT*  Clinical Data:  79 year old female with abdominal pain.  ABDOMINAL ULTRASOUND COMPLETE  Comparison:  None  Findings:  Gallbladder:  Multiple small mobile gallstones are identified, the largest  measuring 8 mm.  Gallbladder wall thickening and small amount of pericholecystic fluid is identified.  These findings likely represent acute cholecystitis.  Common Bile Duct:  There is no evidence of intrahepatic or extrahepatic biliary dilation. The CBD measures 6.0 mm in greatest diameter.  Liver: Mild increased echogenicity of the liver is identified.  No focal abnormalities are identified.  IVC:  Appears normal.  Pancreas:  Although the pancreas is difficult to visualize in its entirety, no focal pancreatic abnormality is identified.  Spleen:  Within normal limits in size and echotexture.  Right kidney:  The right kidney is normal in size and parenchymal echogenicity.  There is no evidence of solid mass, hydronephrosis or definite renal calculi.  The right kidney measures  10.2 cm.  Left kidney:  The left kidney is normal in size and parenchymal echogenicity.  There is no evidence of solid mass, hydronephrosis or definite renal calculi.   The left kidney measures 9.4 cm.  Abdominal Aorta:  No abdominal aortic aneurysm identified.  There is no evidence of ascites.  IMPRESSION: Cholelithiasis with gallbladder wall thickening and pericholecystic fluid - likely representing acute cholecystitis.  No evidence of biliary dilatation.  Increased hepatic echogenicity - question fatty infiltration.   Original Report Authenticated By: Harmon Pier, M.D.    Mr 3d Recon At Scanner  01/02/2013  *RADIOLOGY REPORT*  Clinical Data:  Abdominal pain, gallstones and gallbladder wall thickening  MRI ABDOMEN WITHOUT AND WITH CONTRAST (INCLUDING MRCP)  Technique:  Multiplanar multisequence MR imaging of the abdomen was performed both before and after the administration of intravenous contrast. Heavily T2-weighted images of the biliary and pancreatic ducts were obtained, and three-dimensional MRCP images were rendered by post processing.  Contrast: 15 MULTIHANCE GADOBENATE DIMEGLUMINE 529 MG/ML IV SOLN,  Comparison:  Abdominal  ultrasound 01/01/2013  Findings:  Gallbladder distention, stone, and pericholecystic fluid are identified.  Trace fluid tracks along the right pericolic gutter.  Common duct is normal in caliber without filling defect. No pancreatic ductal dilatation is identified; the pancreas is atrophic.  Mild nonspecific perinephric stranding is identified without hydronephrosis or focal renal mass.  Liver, spleen, and adrenal glands are normal.  No abdominal lymphadenopathy.  Small porta hepatis lymph node measures 1 cm in maximal short axis diameter.  On postcontrast imaging, images are degraded by patient motion.  No abnormal enhancement is identified.  IMPRESSION: Gallstones, gallbladder distention, and pericholecystic fluid again noted, highly suspicious for acute cholecystitis.  No common or intrahepatic ductal dilatation or ductal filling defect is identified.   Original Report Authenticated By: Christiana Pellant, M.D.    Mr Abd W/wo Cm/mrcp  01/02/2013  *RADIOLOGY REPORT*  Clinical Data:  Abdominal pain, gallstones and gallbladder wall thickening  MRI ABDOMEN WITHOUT AND WITH CONTRAST (INCLUDING MRCP)  Technique:  Multiplanar multisequence MR imaging of the abdomen was performed both before and after the administration of intravenous contrast. Heavily T2-weighted images of the biliary and pancreatic ducts were obtained, and three-dimensional MRCP images were rendered by post processing.  Contrast: 15 MULTIHANCE GADOBENATE DIMEGLUMINE 529 MG/ML IV SOLN,  Comparison:  Abdominal ultrasound 01/01/2013  Findings:  Gallbladder distention, stone, and pericholecystic fluid are identified.  Trace fluid tracks along the right pericolic gutter.  Common duct is normal in caliber without filling defect. No pancreatic ductal dilatation is identified; the pancreas is atrophic.  Mild nonspecific perinephric stranding is identified without hydronephrosis or focal renal mass.  Liver, spleen, and adrenal glands are normal.  No abdominal  lymphadenopathy.  Small porta hepatis lymph node measures 1 cm in maximal short axis diameter.  On postcontrast imaging, images are degraded by patient motion.  No abnormal enhancement is identified.  IMPRESSION: Gallstones, gallbladder distention, and pericholecystic fluid again noted, highly suspicious for acute cholecystitis.  No common or intrahepatic ductal dilatation or ductal filling defect is identified.   Original Report Authenticated By: Christiana Pellant, M.D.     Anti-infectives: Anti-infectives   Start     Dose/Rate Route Frequency Ordered Stop   01/01/13 2200  piperacillin-tazobactam (ZOSYN) IVPB 3.375 g     3.375 g 12.5 mL/hr over 240 Minutes Intravenous 3 times per day 01/01/13 1817     01/01/13 1345  piperacillin-tazobactam (ZOSYN) IVPB 3.375 g     3.375 g 12.5 mL/hr over 240 Minutes Intravenous  Once 01/01/13 1339 01/01/13 1745      Assessment/Plan: s/p Procedure(s): LAPAROSCOPIC CHOLECYSTECTOMY WITH INTRAOPERATIVE CHOLANGIOGRAM (N/A) . Cholecystitis, elevated bilirubin : MRCP shows just cholecystitis - for lap chole/IOC today. On Zosyn.  I discussed the procedure in detail.We discussed the risks and benefits of a laparoscopic cholecystectomy and possible cholangiogram including, but not limited to bleeding, infection, injury to surrounding structures such as the intestine or liver, bile leak, retained gallstones, need to convert to an open procedure, prolonged diarrhea, blood clots such as  DVT, common bile duct injury, anesthesia risks, and possible need for additional procedures.  The likelihood of improvement in symptoms and return to the patient's normal status is good.  We discussed the typical post-operative recovery course. I also called her husband, Dr. Glory Buff, at her bedside and discussed the plan and answered his questions.   LOS: 2 days    Jashaun Penrose E 01/03/2013

## 2013-01-03 NOTE — Transfer of Care (Signed)
Immediate Anesthesia Transfer of Care Note  Patient: Pamela Cummings  Procedure(s) Performed: Procedure(s): LAPAROSCOPIC CHOLECYSTECTOMY WITH INTRAOPERATIVE CHOLANGIOGRAM (N/A)  Patient Location: PACU  Anesthesia Type:General  Level of Consciousness: awake, alert , oriented and patient cooperative  Airway & Oxygen Therapy: Patient Spontanous Breathing and Patient connected to nasal cannula oxygen  Post-op Assessment: Report given to PACU RN, Post -op Vital signs reviewed and stable and Patient moving all extremities X 4  Post vital signs: Reviewed and stable  Complications: No apparent anesthesia complications

## 2013-01-03 NOTE — Anesthesia Postprocedure Evaluation (Signed)
  Anesthesia Post-op Note  Patient: Pamela Cummings  Procedure(s) Performed: Procedure(s): LAPAROSCOPIC CHOLECYSTECTOMY WITH INTRAOPERATIVE CHOLANGIOGRAM (N/A)  Patient Location: PACU  Anesthesia Type:General  Level of Consciousness: awake, oriented and patient cooperative  Airway and Oxygen Therapy: Patient Spontanous Breathing  Post-op Pain: mild  Post-op Assessment: Post-op Vital signs reviewed, Patient's Cardiovascular Status Stable, Respiratory Function Stable, Patent Airway, No signs of Nausea or vomiting and Pain level controlled  Post-op Vital Signs: stable  Complications: No apparent anesthesia complications

## 2013-01-03 NOTE — Op Note (Signed)
01/01/2013 - 01/03/2013  10:36 AM  PATIENT:  Pamela Cummings A Bazile  79 y.o. female  PRE-OPERATIVE DIAGNOSIS:  cholecystitis  POST-OPERATIVE DIAGNOSIS:  cholecystitis  PROCEDURE:  Procedure(s): LAPAROSCOPIC CHOLECYSTECTOMY WITH INTRAOPERATIVE CHOLANGIOGRAM  SURGEON:  Surgeon(s): [name], MD  PHYSICIAN ASSISTANT:   ASSISTANTS: Aris Georgia, PAC, Diamantina Monks, PAC  ANESTHESIA:   local and general  EBL:  Total I/O In: 1000 [I.V.:1000] Out: -   BLOOD ADMINISTERED:none  DRAINS: none   SPECIMEN:  Excision  DISPOSITION OF SPECIMEN:  PATHOLOGY  COUNTS:  YES  DICTATION: .Dragon Dictation  Patient was admitted with cholecystitis and hyperbilirubinemia.  MRCP showed no CBD abnormality.  We are proceeding with cholecystectomy. Informed consent was obtained. The patient is on antibiotic protocol IV. She was identified in the preop holding area. She is brought to the operating room. General endotracheal anesthesia was administered by the anesthesia staff. Her abdomen was prepped and draped in sterile fashion. We did time out procedure. Infra-umbilical region was infiltrated with quarter percent Marcaine with epinephrine. Infraumbilical incision was made. Subcutaneous tissues were dissected down revealing the anterior fascia. This was divided along the midline. Peritoneal cavity was entered under direct vision carefully. 0 Vicryl pursestring suture was placed on the fascial opening. Hassan trocar was inserted. Abdomen was insufflated with carbon dioxide in standard fashion. Laparoscopic expiration revealed a very edematous and inflamed gallbladder. A 5 mm epigastric and 2 5 mm right-sided ports were placed under direct vision. Local was used at each port site as well. Dome the gallbladder was retracted superior medially. The infundibulum was covered by omental adhesions. These were filmy and swept away. Infundibulum was retracted inferior laterally. Dissection began laterally and progressed  medially easily identifying the cystic duct. This was circumferentially dissected. Dissection continued until critical view was obtained between the liver cystic duct and the gallbladder. Once we had good visualization, A clip was placed on the infundibular cystic duct junction,a small nick was made in the cystic duct. Intraoperative cholangiogram was obtained demonstrating no common bile duct filling defects and good flow of contrast into the duodenum. Cholangiocatheter was removed. 3 clips were placed proximally on the cystic duct and it was divided. Further dissection revealed the anterior and posterior branch of the cystic artery. Each was dissected out, clipped twice proximally, once distally, and divided. Gallbladder was taken off the liver bed with Bovie cautery. Excellent hemostasis was obtained. Gallbladder was placed in an Endo Catch bag and removed from the infraumbilical port site. Liver bed was copiously irrigated. It was dry. Clips remain in good position. Irrigation fluid was evacuated and it was clear. 4 quadrants Inspection revealed no gross abnormality.Ports were removed under direct vision. Pneumoperitoneum was released. Infraumbilical fascia was closed by tying the 0 Vicryl pursestring suture with care not to trap any intra-abdominal contents. All 4 wounds were copiously irrigated and the skin of each was closed with running 4-0 Vicryl followed by Dermabond. All counts were correct. Patient was taken recovery in stable condition. No apparent complications.  PATIENT DISPOSITION:  PACU - hemodynamically stable.   Delay start of Pharmacological VTE agent (>24hrs) due to surgical blood loss or risk of bleeding:  no  Violeta Gelinas, MD, MPH, FACS Pager: [phone]  5/12/201410:36 AM

## 2013-01-03 NOTE — Progress Notes (Signed)
  Hypoglycemic Event  CBG: 56  Treatment: D50 25ml given  Symptoms: pt asymptomatic  Pt taken down to OR for surgery. Unable to to one hour follow up.  Follow-up CBG: Time  CBG Result  Possible Reasons for Event: Pt NPO for surgery.  Comments/MD notified:    Talmage Nap  Remember to initiate Hypoglycemia Order Set & complete

## 2013-01-03 NOTE — Progress Notes (Signed)
PT Cancellation Note  Patient Details Name: Pamela Cummings MRN: [id number] DOB: Mar 17, 1932   Cancelled Treatment:    Reason Eval/Treat Not Completed: Other (comment) (pt refused---niot ready) 01/03/2013  Eden Bing, PT [phone]  (pager)   Earnest Mcgillis, [name] 01/03/2013, 3:37 PM

## 2013-01-03 NOTE — Anesthesia Preprocedure Evaluation (Addendum)
Anesthesia Evaluation  Patient identified by MRN, date of birth, ID band Patient awake    Reviewed: Allergy & Precautions, H&P , NPO status , Patient's Chart, lab work & pertinent test results, reviewed documented beta blocker date and time   History of Anesthesia Complications Negative for: history of anesthetic complications  Airway Mallampati: II TM Distance: >3 FB Neck ROM: Full    Dental  (+) Dental Advisory Given   Pulmonary neg pulmonary ROS,    Pulmonary exam normal       Cardiovascular hypertension, Pt. on medications and Pt. on home beta blockers     Neuro/Psych PSYCHIATRIC DISORDERS Dementia   GI/Hepatic Neg liver ROS, GERD-  ,  Endo/Other  negative endocrine ROS  Renal/GU negative Renal ROS     Musculoskeletal negative musculoskeletal ROS (+)   Abdominal Normal abdominal exam  (+)   Peds  Hematology negative hematology ROS (+)   Anesthesia Other Findings Dementia  Reproductive/Obstetrics                       Anesthesia Physical Anesthesia Plan  ASA: II  Anesthesia Plan: General   Post-op Pain Management:    Induction: Intravenous  Airway Management Planned: Oral ETT  Additional Equipment:   Intra-op Plan:   Post-operative Plan: Extubation in OR  Informed Consent: I have reviewed the patients History and Physical, chart, labs and discussed the procedure including the risks, benefits and alternatives for the proposed anesthesia with the patient or authorized representative who has indicated his/her understanding and acceptance.   Dental advisory given  Plan Discussed with: CRNA, Anesthesiologist and Surgeon  Anesthesia Plan Comments:        Anesthesia Quick Evaluation

## 2013-01-03 NOTE — Progress Notes (Signed)
Pt arrived back from PACU in the bed, family at bedside, pt is alert and oriented, VSS, 4 lap sites to the ABD that are unremarkable, OTA, NAD, will continue to monitor.

## 2013-01-03 NOTE — Progress Notes (Signed)
Patient ID: Pamela Cummings, female   DOB: [birth year], 79 y.o.   MRN: [id number] Pt husband concerned about patients abdominal pain after surgery and feels she is distended.  She feels fine when I see her she complains only of sore throat.  Her abdomen is not distended, she is appropriately tender, has mild bruising around umbilicus.  I think she is as expected postop.  He also has concerns over his ability to take her home that I asked him to express in am.

## 2013-01-03 NOTE — Progress Notes (Signed)
Subjective: Patient was awake had a good night sleep but has been up since 5 AM. No pain no nausea or vomiting. Discussed the MRCP and once again reiterated that we're waiting surgical timing  Objective: Vital signs in last 24 hours: Temp:  [97.9 F (36.6 C)-98.4 F (36.9 C)] 97.9 F (36.6 C) (05/12 0453) Pulse Rate:  [57-77] 57 (05/12 0453) Resp:  [18] 18 (05/12 0453) BP: (123-171)/(43-62) 167/62 mmHg (05/12 0453) SpO2:  [94 %-100 %] 94 % (05/12 0453) Weight change:   CBG (last 3)  No results found for this basename: GLUCAP,  in the last 72 hours  Intake/Output from previous day:    Physical Exam: Alert awake no distress. No JVD. Lungs are clear. Cardiovascular exam regular rate and rhythm no murmur. Abdomen is soft and nontender. No peripheral edema   Lab Results:  Recent Labs  01/02/13 0555 01/02/13 0752  NA 139 139  K 3.3* 3.4*  CL 104 104  CO2 24 24  GLUCOSE 75 75  BUN 7 7  CREATININE 0.87 0.82  CALCIUM 8.3* 8.5    Recent Labs  01/02/13 0555 01/02/13 0752  AST 114* 117*  ALT 137* 141*  ALKPHOS 96 98  BILITOT 3.0* 3.1*  PROT 5.6* 6.0  ALBUMIN 2.6* 2.7*    Recent Labs  01/02/13 0555 01/02/13 0752  WBC 5.4 4.9  HGB 11.8* 12.3  HCT 35.3* 36.7  MCV 91.7 92.0  PLT 155 168   Lab Results  Component Value Date   INR 0.9 06/28/2007    Recent Labs  01/01/13 1055  TROPONINI <0.30   No results found for this basename: TSH, T4TOTAL, FREET3, T3FREE, THYROIDAB,  in the last 72 hours No results found for this basename: VITAMINB12, FOLATE, FERRITIN, TIBC, IRON, RETICCTPCT,  in the last 72 hours  Studies/Results: Dg Chest 2 View  01/01/2013  *RADIOLOGY REPORT*  Clinical Data: Weakness. Shortness of breath.  CHEST - 2 VIEW  Comparison: 06/28/2007  Findings: Tortuous thoracic aorta noted with prominence of the ascending thoracic aorta.  No cardiomegaly.  No pleural effusion. Mild thoracic spondylosis is present.  Low lung volumes observed on the frontal  view with suspected subsegmental atelectasis at the lung bases.  IMPRESSION:  1.  Subsegmental atelectasis at the lung bases. 2.  Prominence the ascending aorta may be due to tortuosity or conceivably ascending aortic aneurysm.   Original Report Authenticated By: Gaylyn Rong, M.D.    US Abdomen Complete  01/01/2013  *RADIOLOGY REPORT*  Clinical Data:  79 year old female with abdominal pain.  ABDOMINAL ULTRASOUND COMPLETE  Comparison:  None  Findings:  Gallbladder:  Multiple small mobile gallstones are identified, the largest measuring 8 mm.  Gallbladder wall thickening and small amount of pericholecystic fluid is identified.  These findings likely represent acute cholecystitis.  Common Bile Duct:  There is no evidence of intrahepatic or extrahepatic biliary dilation. The CBD measures 6.0 mm in greatest diameter.  Liver: Mild increased echogenicity of the liver is identified.  No focal abnormalities are identified.  IVC:  Appears normal.  Pancreas:  Although the pancreas is difficult to visualize in its entirety, no focal pancreatic abnormality is identified.  Spleen:  Within normal limits in size and echotexture.  Right kidney:  The right kidney is normal in size and parenchymal echogenicity.  There is no evidence of solid mass, hydronephrosis or definite renal calculi.  The right kidney measures  10.2 cm.  Left kidney:  The left kidney is normal in size and parenchymal echogenicity.  There is no evidence of solid mass, hydronephrosis or definite renal calculi.   The left kidney measures 9.4 cm.  Abdominal Aorta:  No abdominal aortic aneurysm identified.  There is no evidence of ascites.  IMPRESSION: Cholelithiasis with gallbladder wall thickening and pericholecystic fluid - likely representing acute cholecystitis.  No evidence of biliary dilatation.  Increased hepatic echogenicity - question fatty infiltration.   Original Report Authenticated By: Harmon Pier, M.D.    Mr 3d Recon At Scanner  01/02/2013   *RADIOLOGY REPORT*  Clinical Data:  Abdominal pain, gallstones and gallbladder wall thickening  MRI ABDOMEN WITHOUT AND WITH CONTRAST (INCLUDING MRCP)  Technique:  Multiplanar multisequence MR imaging of the abdomen was performed both before and after the administration of intravenous contrast. Heavily T2-weighted images of the biliary and pancreatic ducts were obtained, and three-dimensional MRCP images were rendered by post processing.  Contrast: 15 MULTIHANCE GADOBENATE DIMEGLUMINE 529 MG/ML IV SOLN,  Comparison:  Abdominal ultrasound 01/01/2013  Findings:  Gallbladder distention, stone, and pericholecystic fluid are identified.  Trace fluid tracks along the right pericolic gutter.  Common duct is normal in caliber without filling defect. No pancreatic ductal dilatation is identified; the pancreas is atrophic.  Mild nonspecific perinephric stranding is identified without hydronephrosis or focal renal mass.  Liver, spleen, and adrenal glands are normal.  No abdominal lymphadenopathy.  Small porta hepatis lymph node measures 1 cm in maximal short axis diameter.  On postcontrast imaging, images are degraded by patient motion.  No abnormal enhancement is identified.  IMPRESSION: Gallstones, gallbladder distention, and pericholecystic fluid again noted, highly suspicious for acute cholecystitis.  No common or intrahepatic ductal dilatation or ductal filling defect is identified.   Original Report Authenticated By: Christiana Pellant, M.D.    Mr Abd W/wo Cm/mrcp  01/02/2013  *RADIOLOGY REPORT*  Clinical Data:  Abdominal pain, gallstones and gallbladder wall thickening  MRI ABDOMEN WITHOUT AND WITH CONTRAST (INCLUDING MRCP)  Technique:  Multiplanar multisequence MR imaging of the abdomen was performed both before and after the administration of intravenous contrast. Heavily T2-weighted images of the biliary and pancreatic ducts were obtained, and three-dimensional MRCP images were rendered by post processing.  Contrast:  15 MULTIHANCE GADOBENATE DIMEGLUMINE 529 MG/ML IV SOLN,  Comparison:  Abdominal ultrasound 01/01/2013  Findings:  Gallbladder distention, stone, and pericholecystic fluid are identified.  Trace fluid tracks along the right pericolic gutter.  Common duct is normal in caliber without filling defect. No pancreatic ductal dilatation is identified; the pancreas is atrophic.  Mild nonspecific perinephric stranding is identified without hydronephrosis or focal renal mass.  Liver, spleen, and adrenal glands are normal.  No abdominal lymphadenopathy.  Small porta hepatis lymph node measures 1 cm in maximal short axis diameter.  On postcontrast imaging, images are degraded by patient motion.  No abnormal enhancement is identified.  IMPRESSION: Gallstones, gallbladder distention, and pericholecystic fluid again noted, highly suspicious for acute cholecystitis.  No common or intrahepatic ductal dilatation or ductal filling defect is identified.   Original Report Authenticated By: Christiana Pellant, M.D.      Assessment/Plan: #1 cholecystitis confirmed by MRCP as suspected from the start still await timing for surgery  #2 essential hypertension stable  #3 mild dementia stable  Await anticipated cholecystectomy. Moving head is is possible be in her best interest from a cognitive standpoint. All issues medically stable   LOS: 2 days   Kyleen Villatoro A 01/03/2013, 7:33 AM

## 2013-01-03 NOTE — Progress Notes (Signed)
UR completed. Adal Sereno RNBSN 

## 2013-01-03 NOTE — Preoperative (Signed)
Beta Blockers   Reason not to administer Beta Blockers:Not Applicable 

## 2013-01-04 ENCOUNTER — Encounter (HOSPITAL_COMMUNITY): Payer: Self-pay | Admitting: General Surgery

## 2013-01-04 DIAGNOSIS — R5381 Other malaise: Secondary | ICD-10-CM

## 2013-01-04 NOTE — Evaluation (Signed)
Physical Therapy Evaluation Patient Details Name: Pamela Cummings MRN: [id number] DOB: Oct 31, 1931 Today's Date: 01/04/2013 Time: 8295-6213 PT Time Calculation (min): 47 min  PT Assessment / Plan / Recommendation Clinical Impression  79 y.o. female admitted to [facility] with abdominal pain, N/V and progressive weakness.  She is now POD #1 lap chole with intraoperative cholangiogram.  She presents generally weak and deconditioned with limited activity tolerance and weak bil legs.  She now needs to use a RW for gait safety and stability.  There are also signs of confusion, disorientation and memory impairment.  Her husband and I chatted at length about her discharge options and he is unsure of which path they will ultimately choose, but is agreeable with an inpatient rehab consult.  I think inpatient rehab would be her best option for a speedy recovery to get her back to baseline.      PT Assessment  Patient needs continued PT services    Follow Up Recommendations  CIR    Does the patient have the potential to tolerate intense rehabilitation     yes  Barriers to Discharge None None    Equipment Recommendations  Rolling walker with 5" wheels;Other (comment) (rollator (4 wheel RW with seat),  HH aide)    Recommendations for Other Services Rehab consult   Frequency Min 3X/week    Precautions / Restrictions Precautions Precautions: Fall Precaution Comments: h/o scoliosis in her spine.  Husband reports she was not in the best physical health at baseline   Pertinent Vitals/Pain See vitals flow sheet.       Mobility  Transfers Transfers: Sit to Stand;Stand to Sit Sit to Stand: 3: Mod assist;With upper extremity assist;With armrests;From chair/3-in-1 Stand to Sit: 3: Mod assist;With upper extremity assist;With armrests;To chair/3-in-1 Details for Transfer Assistance: multiple sit to stands performed Ambulation/Gait Ambulation/Gait Assistance: 3: Mod assist Ambulation Distance (Feet): 15  Feet (x2) Assistive device: Rolling walker Ambulation/Gait Assistance Details: mod assist to support trunk over buckling bil legs.  Pt fatigued quickly walking to the sink and needed a chair to sit and rest before walking back.   Gait Pattern: Step-through pattern;Shuffle;Trunk flexed Gait velocity: less than 1.8 ft/sec indicating risk for recurrent falls General Gait Details: needed verbal cues to stay closer to the RW.  Using a RW is new for the patient.  She used a cane only periodically PTA.          PT Diagnosis: Difficulty walking;Generalized weakness;Abnormality of gait;Altered mental status  PT Problem List: Decreased strength;Decreased activity tolerance;Decreased mobility;Decreased balance;Decreased cognition;Decreased knowledge of use of DME;Obesity;Pain PT Treatment Interventions: DME instruction;Gait training;Stair training;Functional mobility training;Therapeutic activities;Therapeutic exercise;Balance training;Neuromuscular re-education;Cognitive remediation;Patient/family education   PT Goals Acute Rehab PT Goals PT Goal Formulation: With patient/family Time For Goal Achievement: 01/18/13 Potential to Achieve Goals: Good Pt will go Supine/Side to Sit: with modified independence PT Goal: Supine/Side to Sit - Progress: Goal set today Pt will go Sit to Supine/Side: with modified independence PT Goal: Sit to Supine/Side - Progress: Goal set today Pt will go Sit to Stand: with supervision PT Goal: Sit to Stand - Progress: Goal set today Pt will go Stand to Sit: with supervision PT Goal: Stand to Sit - Progress: Goal set today Pt will Transfer Bed to Chair/Chair to Bed: with supervision PT Transfer Goal: Bed to Chair/Chair to Bed - Progress: Goal set today Pt will Ambulate: 51 - 150 feet;with supervision;with rolling walker PT Goal: Ambulate - Progress: Goal set today Pt will Go Up / Down Stairs:  3-5 stairs;with min assist;with rail(s) PT Goal: Up/Down Stairs - Progress: Goal  set today  Visit Information  Last PT Received On: 01/04/13 Assistance Needed: +1    Subjective Data  Subjective: Pt reports she was not here last night.   Patient Stated Goal: to get stronger and go home with her husband   Prior Functioning  Home Living Lives With: Spouse Available Help at Discharge: Available PRN/intermittently (cannotbe there 24 hours) Type of Home: House Home Access: Stairs to enter [employer] of Steps: 3 Entrance Stairs-Rails: Right;Left Home Layout: Two level;Other (Comment) (no need to go upstairs) Bathroom Shower/Tub: Walk-in shower;Door [employer] Toilet: Standard Bathroom Accessibility: Yes How Accessible: Accessible via walker Home Adaptive Equipment: Quad cane;Grab bars in shower;Wheelchair - manual Additional Comments: uses cane at times at home Prior Function Level of Independence: Independent with assistive device(s) Able to Take Stairs?: Yes Driving: Yes Vocation: Retired Musician: No difficulties Dominant Hand: Right    Cognition  Cognition Arousal/Alertness: Awake/alert Behavior During Therapy: WFL for tasks assessed/performed Overall Cognitive Status: Impaired/Different from baseline Area of Impairment: Memory;Orientation Orientation Level: Time;Situation Memory: Decreased short-term memory General Comments: Thought that she was not in the hospital last night.  Not really clear why she is here,  Needed max cues to report she had surgery.      Extremity/Trunk Assessment Right Lower Extremity Assessment RLE ROM/Strength/Tone: Deficits RLE ROM/Strength/Tone Deficits: functionally bil legs buckling after short distance gait.   RLE Sensation: WFL - Light Touch (occationally) Left Lower Extremity Assessment LLE ROM/Strength/Tone: Deficits LLE ROM/Strength/Tone Deficits: functionally bil legs buckling after short distance gait.   LLE Sensation: WFL - Light Touch (occationally)   Balance Balance Balance  Assessed: Yes Static Standing Balance Static Standing - Balance Support: Bilateral upper extremity supported Static Standing - Level of Assistance: 4: Min assist Static Standing - Comment/# of Minutes: standing washing hands at sink leaning on bil elbows for support .  Pt needed min assist to ensure safety.    End of Session PT - End of Session Activity Tolerance: Patient limited by fatigue Patient left: in chair;with call bell/phone within reach;with family/visitor present (husband in room assisting with session) Nurse Communication: Mobility status;Other (comment) (d/c recommendations)       Ladaija Dimino B. Lorin Hauck, PT, DPT [phone]   01/04/2013, 11:41 AM

## 2013-01-04 NOTE — Progress Notes (Signed)
ANTIBIOTIC CONSULT NOTE  Pharmacy Consult for Zosyn Indication: cholecystitis  No Known Allergies  Vital Signs: Temp: 98.4 F (36.9 C) (05/13 0601) Temp src: Oral (05/13 0601) BP: 127/48 mmHg (05/13 0601) Pulse Rate: 58 (05/13 0601) Intake/Output from previous day: 05/12 0701 - 05/13 0700 In: 1530 [P.O.:360; I.V.:1170] Out: 20 [Blood:20] Intake/Output from this shift:    Labs:  Recent Labs  01/02/13 0555 01/02/13 0752 01/03/13 0624  WBC 5.4 4.9 5.0  HGB 11.8* 12.3 12.3  PLT 155 168 167  CREATININE 0.87 0.82 0.79   Estimated Creatinine Clearance: 60.9 ml/min (by C-G formula based on Cr of 0.79). No results found for this basename: VANCOTROUGH, Leodis Binet, VANCORANDOM, GENTTROUGH, GENTPEAK, GENTRANDOM, TOBRATROUGH, TOBRAPEAK, TOBRARND, AMIKACINPEAK, AMIKACINTROU, AMIKACIN,  in the last 72 hours   Microbiology: Recent Results (from the past 720 hour(s))  SURGICAL PCR SCREEN     Status: None   Collection Time    01/02/13  8:38 PM      Result Value Range Status   MRSA, PCR NEGATIVE  NEGATIVE Final   Staphylococcus aureus NEGATIVE  NEGATIVE Final   Comment:            The Xpert SA Assay (FDA     approved for NASAL specimens     in patients over 18 years of age),     is one component of     a comprehensive surveillance     program.  Test performance has     been validated by [employer] for patients greater     than or equal to 7 year old.     It is not intended     to diagnose infection nor to     guide or monitor treatment.    Medical History: Past Medical History  Diagnosis Date  . Dementia   . Hypertension   . GERD (gastroesophageal reflux disease)     Medications:  Scheduled:  . atenolol  50 mg Oral Daily  . chlorthalidone  25 mg Oral Daily  . cyclobenzaprine  10 mg Oral QHS  . donepezil  10 mg Oral QHS  . enoxaparin (LOVENOX) injection  40 mg Subcutaneous Q24H  . gabapentin  300 mg Oral Daily  . gadobenate dimeglumine  15 mL Intravenous  Once  . [COMPLETED] ondansetron  4 mg Oral Once  . pantoprazole (PROTONIX) IV  40 mg Intravenous QHS  . piperacillin-tazobactam (ZOSYN)  IV  3.375 g Intravenous Q8H  . Vilazodone HCl  40 mg Oral Daily  . [DISCONTINUED] enoxaparin (LOVENOX) injection  40 mg Subcutaneous Q24H   Assessment: 79 yr old female POD#2 for lap choley.  Zosyn d#4.  Renal function stable.  WBC wnl, afeb.   Goal of Therapy:  Eradication of infection  Plan:  - Continue Zosyn 3.375gm IV q8hrs (4 hour infusion). - F/u renal function, clinical status  Genene Kilman L. Illene Bolus, PharmD, BCPS Clinical Pharmacist Pager: [phone] Pharmacy: [phone] 01/04/2013 9:04 AM

## 2013-01-04 NOTE — Progress Notes (Signed)
1 Day Post-Op  Subjective: Doing okay this morning. Having some abdominal pain.  Large diarrhea yesterday.  No nausea or vomiting.  Tolerated liquids well yesterday.  Objective: Vital signs in last 24 hours: Temp:  [97.1 F (36.2 C)-98.4 F (36.9 C)] 98.4 F (36.9 C) (05/13 0601) Pulse Rate:  [54-86] 58 (05/13 0601) Resp:  [12-20] 20 (05/13 0601) BP: (125-176)/(45-93) 127/48 mmHg (05/13 0601) SpO2:  [91 %-98 %] 93 % (05/13 0601) Last BM Date: 01/03/13  Intake/Output from previous day: 05/12 0701 - 05/13 0700 In: 1530 [P.O.:360; I.V.:1170] Out: 20 [Blood:20] Intake/Output this shift:    Physical Exam Gen: alert, sitting in chair Abd: +BS, mild distension, tender around umbilicus and incision in RUQ, bruising noted around umbilical incision, incisions are clean, dry and intact.  Lab Results:   Recent Labs  01/02/13 0752 01/03/13 0624  WBC 4.9 5.0  HGB 12.3 12.3  HCT 36.7 36.9  PLT 168 167   BMET  Recent Labs  01/02/13 0752 01/03/13 0624  NA 139 139  K 3.4* 3.9  CL 104 104  CO2 24 20  GLUCOSE 75 57*  BUN 7 5*  CREATININE 0.82 0.79  CALCIUM 8.5 8.8   PT/INR No results found for this basename: LABPROT, INR,  in the last 72 hours ABG No results found for this basename: PHART, PCO2, PO2, HCO3,  in the last 72 hours  Studies/Results: Dg Cholangiogram Operative  01/03/2013  *RADIOLOGY REPORT*  Clinical Data: Cholecystitis  INTRAOPERATIVE CHOLANGIOGRAM  Technique:  Multiple fluoroscopic spot radiographs were obtained during intraoperative cholangiogram and are submitted for interpretation post-operatively.  Comparison: None.  Findings: No persistent filling defects in the common duct. Intrahepatic ducts are incompletely visualized, appearing decompressed centrally. Contrast passes into the duodenum.  IMPRESSION  Negative for retained common duct stone.   Original Report Authenticated By: D. Andria Rhein, MD    Mr 3d Recon At Scanner  01/02/2013  *RADIOLOGY REPORT*   Clinical Data:  Abdominal pain, gallstones and gallbladder wall thickening  MRI ABDOMEN WITHOUT AND WITH CONTRAST (INCLUDING MRCP)  Technique:  Multiplanar multisequence MR imaging of the abdomen was performed both before and after the administration of intravenous contrast. Heavily T2-weighted images of the biliary and pancreatic ducts were obtained, and three-dimensional MRCP images were rendered by post processing.  Contrast: 15 MULTIHANCE GADOBENATE DIMEGLUMINE 529 MG/ML IV SOLN,  Comparison:  Abdominal ultrasound 01/01/2013  Findings:  Gallbladder distention, stone, and pericholecystic fluid are identified.  Trace fluid tracks along the right pericolic gutter.  Common duct is normal in caliber without filling defect. No pancreatic ductal dilatation is identified; the pancreas is atrophic.  Mild nonspecific perinephric stranding is identified without hydronephrosis or focal renal mass.  Liver, spleen, and adrenal glands are normal.  No abdominal lymphadenopathy.  Small porta hepatis lymph node measures 1 cm in maximal short axis diameter.  On postcontrast imaging, images are degraded by patient motion.  No abnormal enhancement is identified.  IMPRESSION: Gallstones, gallbladder distention, and pericholecystic fluid again noted, highly suspicious for acute cholecystitis.  No common or intrahepatic ductal dilatation or ductal filling defect is identified.   Original Report Authenticated By: Christiana Pellant, M.D.    Mr Abd W/wo Cm/mrcp  01/02/2013  *RADIOLOGY REPORT*  Clinical Data:  Abdominal pain, gallstones and gallbladder wall thickening  MRI ABDOMEN WITHOUT AND WITH CONTRAST (INCLUDING MRCP)  Technique:  Multiplanar multisequence MR imaging of the abdomen was performed both before and after the administration of intravenous contrast. Heavily T2-weighted images of the  biliary and pancreatic ducts were obtained, and three-dimensional MRCP images were rendered by post processing.  Contrast: 15 MULTIHANCE  GADOBENATE DIMEGLUMINE 529 MG/ML IV SOLN,  Comparison:  Abdominal ultrasound 01/01/2013  Findings:  Gallbladder distention, stone, and pericholecystic fluid are identified.  Trace fluid tracks along the right pericolic gutter.  Common duct is normal in caliber without filling defect. No pancreatic ductal dilatation is identified; the pancreas is atrophic.  Mild nonspecific perinephric stranding is identified without hydronephrosis or focal renal mass.  Liver, spleen, and adrenal glands are normal.  No abdominal lymphadenopathy.  Small porta hepatis lymph node measures 1 cm in maximal short axis diameter.  On postcontrast imaging, images are degraded by patient motion.  No abnormal enhancement is identified.  IMPRESSION: Gallstones, gallbladder distention, and pericholecystic fluid again noted, highly suspicious for acute cholecystitis.  No common or intrahepatic ductal dilatation or ductal filling defect is identified.   Original Report Authenticated By: Christiana Pellant, M.D.     Anti-infectives: Anti-infectives   Start     Dose/Rate Route Frequency Ordered Stop   01/01/13 2200  piperacillin-tazobactam (ZOSYN) IVPB 3.375 g     3.375 g 12.5 mL/hr over 240 Minutes Intravenous 3 times per day 01/01/13 1817     01/01/13 1345  piperacillin-tazobactam (ZOSYN) IVPB 3.375 g     3.375 g 12.5 mL/hr over 240 Minutes Intravenous  Once 01/01/13 1339 01/01/13 1745      Assessment/Plan: Cholecystitis: s/p Procedure(s): LAPAROSCOPIC CHOLECYSTECTOMY WITH INTRAOPERATIVE CHOLANGIOGRAM (N/A)  Plan - PT/OT to assist with disposition - consider stopping antibiotics as white count is normal and gallbladder has been removed - dispo pending PT/OT  Patient seen and examined.  Needs significant assistance getting OOB.  May need SNF if she continues to need significant assistance.  Will continue abxs for acute cholecystitis.    LOS: 3 days    BOOTH, ERIN 01/04/2013

## 2013-01-04 NOTE — Progress Notes (Signed)
Subjective: Patient is operative day #1 and did well with her laparoscopic cholecystectomy. Beyond some weakness she's having no pain no nausea or vomiting. Husband is at the bedside  Objective: Vital signs in last 24 hours: Temp:  [97.1 F (36.2 C)-98.8 F (37.1 C)] 98.4 F (36.9 C) (05/13 0601) Pulse Rate:  [54-86] 58 (05/13 0601) Resp:  [12-20] 20 (05/13 0601) BP: (125-181)/(45-93) 127/48 mmHg (05/13 0601) SpO2:  [91 %-99 %] 93 % (05/13 0601) Weight change:   CBG (last 3)   Recent Labs  01/03/13 0830  GLUCAP 56*    Intake/Output from previous day: 05/12 0701 - 05/13 0700 In: 1530 [P.O.:360; I.V.:1170] Out: 20 [Blood:20]  Physical Exam: Patient is awake alert no distress sitting up in a chair. Good facial symmetry. No JVD or bruits. Lungs are clear. Cardiovascular exam regular rate and rhythm no murmur. Extremities without edema. Abdomen is soft mild distention mild tympany good bowel sounds no significant tenderness   Lab Results:  Recent Labs  01/02/13 0752 01/03/13 0624  NA 139 139  K 3.4* 3.9  CL 104 104  CO2 24 20  GLUCOSE 75 57*  BUN 7 5*  CREATININE 0.82 0.79  CALCIUM 8.5 8.8    Recent Labs  01/02/13 0752 01/03/13 0624  AST 117* 72*  ALT 141* 108*  ALKPHOS 98 96  BILITOT 3.1* 2.2*  PROT 6.0 6.0  ALBUMIN 2.7* 2.8*    Recent Labs  01/02/13 0752 01/03/13 0624  WBC 4.9 5.0  HGB 12.3 12.3  HCT 36.7 36.9  MCV 92.0 91.8  PLT 168 167   Lab Results  Component Value Date   INR 0.9 06/28/2007    Recent Labs  01/01/13 1055  TROPONINI <0.30   No results found for this basename: TSH, T4TOTAL, FREET3, T3FREE, THYROIDAB,  in the last 72 hours No results found for this basename: VITAMINB12, FOLATE, FERRITIN, TIBC, IRON, RETICCTPCT,  in the last 72 hours  Studies/Results: Dg Cholangiogram Operative  01/03/2013  *RADIOLOGY REPORT*  Clinical Data: Cholecystitis  INTRAOPERATIVE CHOLANGIOGRAM  Technique:  Multiple fluoroscopic spot radiographs  were obtained during intraoperative cholangiogram and are submitted for interpretation post-operatively.  Comparison: None.  Findings: No persistent filling defects in the common duct. Intrahepatic ducts are incompletely visualized, appearing decompressed centrally. Contrast passes into the duodenum.  IMPRESSION  Negative for retained common duct stone.   Original Report Authenticated By: D. Andria Rhein, MD    Mr 3d Recon At Scanner  01/02/2013  *RADIOLOGY REPORT*  Clinical Data:  Abdominal pain, gallstones and gallbladder wall thickening  MRI ABDOMEN WITHOUT AND WITH CONTRAST (INCLUDING MRCP)  Technique:  Multiplanar multisequence MR imaging of the abdomen was performed both before and after the administration of intravenous contrast. Heavily T2-weighted images of the biliary and pancreatic ducts were obtained, and three-dimensional MRCP images were rendered by post processing.  Contrast: 15 MULTIHANCE GADOBENATE DIMEGLUMINE 529 MG/ML IV SOLN,  Comparison:  Abdominal ultrasound 01/01/2013  Findings:  Gallbladder distention, stone, and pericholecystic fluid are identified.  Trace fluid tracks along the right pericolic gutter.  Common duct is normal in caliber without filling defect. No pancreatic ductal dilatation is identified; the pancreas is atrophic.  Mild nonspecific perinephric stranding is identified without hydronephrosis or focal renal mass.  Liver, spleen, and adrenal glands are normal.  No abdominal lymphadenopathy.  Small porta hepatis lymph node measures 1 cm in maximal short axis diameter.  On postcontrast imaging, images are degraded by patient motion.  No abnormal enhancement is identified.  IMPRESSION: Gallstones, gallbladder distention, and pericholecystic fluid again noted, highly suspicious for acute cholecystitis.  No common or intrahepatic ductal dilatation or ductal filling defect is identified.   Original Report Authenticated By: Christiana Pellant, M.D.    Mr Abd W/wo Cm/mrcp  01/02/2013   *RADIOLOGY REPORT*  Clinical Data:  Abdominal pain, gallstones and gallbladder wall thickening  MRI ABDOMEN WITHOUT AND WITH CONTRAST (INCLUDING MRCP)  Technique:  Multiplanar multisequence MR imaging of the abdomen was performed both before and after the administration of intravenous contrast. Heavily T2-weighted images of the biliary and pancreatic ducts were obtained, and three-dimensional MRCP images were rendered by post processing.  Contrast: 15 MULTIHANCE GADOBENATE DIMEGLUMINE 529 MG/ML IV SOLN,  Comparison:  Abdominal ultrasound 01/01/2013  Findings:  Gallbladder distention, stone, and pericholecystic fluid are identified.  Trace fluid tracks along the right pericolic gutter.  Common duct is normal in caliber without filling defect. No pancreatic ductal dilatation is identified; the pancreas is atrophic.  Mild nonspecific perinephric stranding is identified without hydronephrosis or focal renal mass.  Liver, spleen, and adrenal glands are normal.  No abdominal lymphadenopathy.  Small porta hepatis lymph node measures 1 cm in maximal short axis diameter.  On postcontrast imaging, images are degraded by patient motion.  No abnormal enhancement is identified.  IMPRESSION: Gallstones, gallbladder distention, and pericholecystic fluid again noted, highly suspicious for acute cholecystitis.  No common or intrahepatic ductal dilatation or ductal filling defect is identified.   Original Report Authenticated By: Christiana Pellant, M.D.      Assessment/Plan: #1 essential hypertension stable   #2 mild dementia stable  #3 status post laparoscopic cholecystectomy for cholecystitis morning lab work pending clinically looks good disposition per surgery   LOS: 3 days   Aydn Ferrara A 01/04/2013, 7:27 AM

## 2013-01-04 NOTE — Progress Notes (Signed)
Rehab Admissions Coordinator Note:  Patient was screened by Pamela Cummings for appropriateness for an Inpatient Acute Rehab Consult.  At this time, we are recommending Skilled Nursing Facility. Limited bed availability for this diagnosis this week.  Pamela Cummings 01/04/2013, 12:00 PM  I can be reached at [phone].

## 2013-01-05 MED ORDER — AMOXICILLIN-POT CLAVULANATE 875-125 MG PO TABS: 1.0000 | ORAL_TABLET | Freq: Two times a day (BID) | ORAL | Status: DC

## 2013-01-05 MED ORDER — OXYCODONE HCL 5 MG PO TABS: 5.0000 mg | ORAL_TABLET | ORAL | Status: DC | PRN

## 2013-01-05 NOTE — Progress Notes (Signed)
2 Days Post-Op  Subjective: Doing okay this morning. Has some soreness, particularly around her belly button.  Continues to have diarrhea stools. No nausea or vomiting.  Tolerating full diet well without nausea or vomiting.  She is excited to leave the hospital.  Objective: Vital signs in last 24 hours: Temp:  [97.7 F (36.5 C)-99 F (37.2 C)] 99 F (37.2 C) (05/14 0514) Pulse Rate:  [54-58] 56 (05/14 0514) Resp:  [18] 18 (05/14 0514) BP: (126-153)/(53-66) 126/53 mmHg (05/14 0514) SpO2:  [92 %-93 %] 92 % (05/14 0514) Last BM Date: 01/04/13  Intake/Output from previous day: 05/13 0701 - 05/14 0700 In: 2575 [P.O.:1200; I.V.:1375] Out: 700 [Urine:700] Intake/Output this shift:    Physical Exam Gen: alert, cooperative, NAD Abd: +BS, no distension, tender around umbilicus,bruising noted around umbilical incision, incisions are clean, dry and intact.  Lab Results:   Recent Labs  01/03/13 0624  WBC 5.0  HGB 12.3  HCT 36.9  PLT 167   BMET  Recent Labs  01/03/13 0624  NA 139  K 3.9  CL 104  CO2 20  GLUCOSE 57*  BUN 5*  CREATININE 0.79  CALCIUM 8.8   PT/INR No results found for this basename: LABPROT, INR,  in the last 72 hours ABG No results found for this basename: PHART, PCO2, PO2, HCO3,  in the last 72 hours  Studies/Results: Dg Cholangiogram Operative  01/03/2013   *RADIOLOGY REPORT*  Clinical Data: Cholecystitis  INTRAOPERATIVE CHOLANGIOGRAM  Technique:  Multiple fluoroscopic spot radiographs were obtained during intraoperative cholangiogram and are submitted for interpretation post-operatively.  Comparison: None.  Findings: No persistent filling defects in the common duct. Intrahepatic ducts are incompletely visualized, appearing decompressed centrally. Contrast passes into the duodenum.  IMPRESSION  Negative for retained common duct stone.   Original Report Authenticated By: D. Andria Rhein, MD    Anti-infectives: Anti-infectives   Start     Dose/Rate Route  Frequency Ordered Stop   01/01/13 2200  piperacillin-tazobactam (ZOSYN) IVPB 3.375 g     3.375 g 12.5 mL/hr over 240 Minutes Intravenous 3 times per day 01/01/13 1817     01/01/13 1345  piperacillin-tazobactam (ZOSYN) IVPB 3.375 g     3.375 g 12.5 mL/hr over 240 Minutes Intravenous  Once 01/01/13 1339 01/01/13 1745      Assessment/Plan: Cholecystitis: s/p Procedure(s): LAPAROSCOPIC CHOLECYSTECTOMY WITH INTRAOPERATIVE CHOLANGIOGRAM (N/A)  Plan - continue heart healthy diet - will transition to Augmentin to complete a 7 day course of antibiotics - will call social work for SNF placement    LOS: 4 days    BOOTH, ERIN 01/05/2013

## 2013-01-05 NOTE — Discharge Summary (Signed)
Mieczyslaw Stamas, MD, MPH, FACS Pager: [phone]  

## 2013-01-05 NOTE — Clinical Social Work Placement (Addendum)
    Clinical Social Work Department CLINICAL SOCIAL WORK PLACEMENT NOTE 01/05/2013  Patient:  Pamela Cummings, Pamela Cummings  Account Number:  [id number] Admit date:  01/01/2013  Clinical Social Worker:  Rozetta Nunnery, Theresia Majors  Date/time:  01/05/2013 11:30 AM  Clinical Social Work is seeking post-discharge placement for this patient at the following level of care:   SKILLED NURSING   (*CSW will update this form in Epic as items are completed)   01/05/2013  Patient/family provided with [facility] Department of Clinical Social Work's list of facilities offering this level of care within the geographic area requested by the patient (or if unable, by the patient's family).  01/05/2013  Patient/family informed of their freedom to choose among providers that offer the needed level of care, that participate in Medicare, Medicaid or managed care program needed by the patient, have an available bed and are willing to accept the patient.  01/05/2013  Patient/family informed of [facility]' ownership interest in [facility], as well as of the fact that they are under no obligation to receive care at this facility.  PASARR submitted to EDS on 01/05/2013 PASARR number received from EDS on   FL2 transmitted to all facilities in geographic area requested by pt/family on  01/05/2013 FL2 transmitted to all facilities within larger geographic area on   Patient informed that his/her managed care company has contracts with or will negotiate with  certain facilities, including the following:     Patient/family informed of bed offers received:  01-05-13 Patient chooses bed at [facility] Physician recommends and patient chooses bed at    Patient to be transferred to [facility] on 01-05-13   Patient to be transferred to facility by [facility] triad ambulance  The following physician request were entered in Epic:   Additional Comments:

## 2013-01-05 NOTE — Progress Notes (Signed)
Patient is seen today in followup more for support. She seems recovering nicely from her laparoscopic cholecystectomy. She is a bit more confused this is not surprising given her underlying cognitive issues and the environmental changes as well as hospitalization. She is currently medically stable. I would suggest disposition today. She truly would do best if husband about a higher help at home or be able to care for her at home but otherwise skilled nursing is most appropriate.

## 2013-01-05 NOTE — Clinical Social Work Note (Addendum)
Clinical Social Worker followed up with patient and her family and they have decided to proceed with [facility]. CSW is awaiting pasarr number.   17:00pm CSW received pasarr number. CSW will facilitate discharge and spoke with family and facility. Patient will be transported via ambulance. CSW will complete discharge packet and place with shadow chart. CSW will sign off, as social work intervention is no longer needed.   Rozetta Nunnery MSW, [employer] [phone]

## 2013-01-05 NOTE — Progress Notes (Signed)
For D/C to SNF [facility] signed Patient examined and I agree with the assessment and plan  Violeta Gelinas, MD, MPH, FACS Pager: [phone]  01/05/2013 11:51 AM

## 2013-01-05 NOTE — Progress Notes (Signed)
Patient discharged to [facility], report given to Laverna Peace RN

## 2013-01-05 NOTE — Discharge Summary (Signed)
Patient ID: HAIDYNN ALMENDAREZ MRN: [id number] DOB/AGE: 77-May-1933 77 y.o.  Admit date: 01/01/2013 Discharge date: 01/05/2013  Procedures: laparoscopic cholecystectomy with IOC  Consults: internal medicine  Reason for Admission: This is a 79 year old female I have been asked to see for the above-mentioned complaints. She has a history of slight dementia. Her husband is a Development worker, community. He reports that she started having back pain several days ago. She has a history of chronic back pain since he did not think much of this. Bowel movements were normal. He awoke the next day and his wife told she had been having nausea and vomiting throughout the night. She seemed to improve but then became weak and he noticed jaundiced and brought her to the emergency room.  Currently, she denies any abdominal pain or nausea. She has no previous history of similar attacks. She denies fevers or chills.  Admission Diagnoses:  Cholelithiasis, possible cholecystitis, possible common bile duct stone  Hospital Course: The patient was admitted and due to an elevated TB an MRCP was ordered which did not reveal choledocholithiasis.  She was taken to the operating room the following day where she underwent a lap chole with IOC which was negative.  She tolerated the procedure well.  She was relatively deconditioned post operatively and PT/OT was consulted.  She was felt to need rehab who felt she needed SNF.  She progressed from surgery well and was tolerating a regular diet and pain was well controlled on POD#2.  She was stable for dc to SNF.  Discharge Diagnoses:  1. Acute cholecystitis, s/p lap chole 2. Dementia 3. HTN  Discharge Medications:   Medication List    TAKE these medications       amoxicillin-clavulanate 875-125 MG per tablet  Commonly known as:  AUGMENTIN  Take 1 tablet by mouth every 12 (twelve) hours.     aspirin EC 81 MG tablet  Take 81 mg by mouth daily.     atenolol-chlorthalidone 50-25 MG per  tablet  Commonly known as:  TENORETIC  Take 1 tablet by mouth daily.     buPROPion 150 MG 12 hr tablet  Commonly known as:  WELLBUTRIN SR  Take 150 mg by mouth daily.     celecoxib 200 MG capsule  Commonly known as:  CELEBREX  Take 200 mg by mouth daily.     clotrimazole-betamethasone cream  Commonly known as:  LOTRISONE  Apply 1 application topically 2 (two) times daily as needed (for rash).     cyclobenzaprine 10 MG tablet  Commonly known as:  FLEXERIL  Take 10 mg by mouth at bedtime.     donepezil 10 MG tablet  Commonly known as:  ARICEPT  Take 10 mg by mouth at bedtime.     estrogens (conjugated) 0.625 MG tablet  Commonly known as:  PREMARIN  Take 0.625 mg by mouth daily.     gabapentin 300 MG capsule  Commonly known as:  NEURONTIN  Take 300 mg by mouth daily.     GLUCOSAMINE HCL PO  Take 1,000 mg by mouth daily.     memantine 10 MG tablet  Commonly known as:  NAMENDA  Take 10 mg by mouth 2 (two) times daily.     omeprazole 20 MG capsule  Commonly known as:  PRILOSEC  Take 20 mg by mouth daily.     oxyCODONE 5 MG immediate release tablet  Commonly known as:  Oxy IR/ROXICODONE  Take 1-2 tablets (5-10 mg total) by mouth every 4 (four)  hours as needed.     VIIBRYD 40 MG Tabs  Generic drug:  Vilazodone HCl  Take 40 mg by mouth daily.     zolpidem 10 MG tablet  Commonly known as:  AMBIEN  Take 10 mg by mouth at bedtime as needed for sleep.        Discharge Instructions: Follow-up Information   Follow up with [facility] On 01/18/2013. (1:15pm, arrive at 12:45pm)    Contact information:   [address]   [place] [address] [phone]       Signed: Letha Cape 01/05/2013, 2:08 PM

## 2013-01-05 NOTE — Clinical Social Work Psychosocial (Signed)
     Clinical Social Work Department BRIEF PSYCHOSOCIAL ASSESSMENT 01/05/2013  Patient:  Pamela Cummings, Pamela Cummings     Account Number:  [id number]     Admit date:  01/01/2013  Clinical Social Worker:  Hulan Fray  Date/Time:  01/05/2013 10:32 AM  Referred by:  Physician  Date Referred:  01/05/2013 Referred for  SNF Placement   Other Referral:   Interview type:  Patient Other interview type:   Husband- Pamela Cummings    PSYCHOSOCIAL DATA Living Status:  HUSBAND Admitted from facility:   Level of care:   Primary support name:  Pamela Cummings Primary support relationship to patient:  SPOUSE Degree of support available:   supportive    CURRENT CONCERNS  Other Concerns:    SOCIAL WORK ASSESSMENT / PLAN Clinical Social Worker received referral for patient needing short term SNF placement at discharge. CSW introduced self and explained reason for visit. Patient's husband was at bedside. CSW provided SNF packet. Patient and husband are agreeable for short term  placement and prefer [facility], [facility] or [facility].    CSW will complete FL2 for MD's signature and update patient and family when bed offers are made.   Assessment/plan status:  Psychosocial Support/Ongoing Assessment of Needs Other assessment/ plan:   Information/referral to community resources:   SNF packet    PATIENTS/FAMILYS RESPONSE TO PLAN OF CARE: Patient and husband are agreeable for short term SNF placement at discharge. Husband voiced preferences for [facility], [facility] and [facility]. Both appreciated CSW's visit and assistance.

## 2013-01-18 ENCOUNTER — Encounter (INDEPENDENT_AMBULATORY_CARE_PROVIDER_SITE_OTHER): Payer: Medicare Other

## 2013-09-27 ENCOUNTER — Ambulatory Visit: Payer: Medicare Other | Attending: Internal Medicine | Admitting: Physical Therapy

## 2013-09-27 DIAGNOSIS — F0390 Unspecified dementia without behavioral disturbance: Secondary | ICD-10-CM | POA: Insufficient documentation

## 2013-09-27 DIAGNOSIS — IMO0001 Reserved for inherently not codable concepts without codable children: Secondary | ICD-10-CM | POA: Insufficient documentation

## 2013-09-27 DIAGNOSIS — R269 Unspecified abnormalities of gait and mobility: Secondary | ICD-10-CM | POA: Insufficient documentation

## 2013-09-27 DIAGNOSIS — I10 Essential (primary) hypertension: Secondary | ICD-10-CM | POA: Insufficient documentation

## 2013-10-03 ENCOUNTER — Ambulatory Visit: Payer: Medicare Other | Admitting: Physical Therapy

## 2013-10-06 ENCOUNTER — Ambulatory Visit: Payer: Medicare Other | Admitting: Physical Therapy

## 2013-10-10 ENCOUNTER — Ambulatory Visit: Payer: Medicare Other | Admitting: Physical Therapy

## 2013-10-13 ENCOUNTER — Ambulatory Visit: Payer: Medicare Other

## 2013-10-17 ENCOUNTER — Ambulatory Visit: Payer: Medicare Other | Admitting: Physical Therapy

## 2013-10-20 ENCOUNTER — Encounter: Payer: PRIVATE HEALTH INSURANCE | Admitting: Physical Therapy

## 2013-10-24 ENCOUNTER — Ambulatory Visit: Payer: Medicare Other | Attending: Internal Medicine | Admitting: Physical Therapy

## 2013-10-24 DIAGNOSIS — I10 Essential (primary) hypertension: Secondary | ICD-10-CM | POA: Insufficient documentation

## 2013-10-24 DIAGNOSIS — R269 Unspecified abnormalities of gait and mobility: Secondary | ICD-10-CM | POA: Insufficient documentation

## 2013-10-24 DIAGNOSIS — IMO0001 Reserved for inherently not codable concepts without codable children: Secondary | ICD-10-CM | POA: Insufficient documentation

## 2013-10-24 DIAGNOSIS — F0390 Unspecified dementia without behavioral disturbance: Secondary | ICD-10-CM | POA: Insufficient documentation

## 2013-10-27 ENCOUNTER — Ambulatory Visit: Payer: Medicare Other | Admitting: Physical Therapy

## 2013-10-31 ENCOUNTER — Ambulatory Visit: Payer: Medicare Other | Admitting: Physical Therapy

## 2013-11-03 ENCOUNTER — Ambulatory Visit: Payer: Medicare Other | Admitting: Physical Therapy

## 2013-11-07 ENCOUNTER — Ambulatory Visit: Payer: Medicare Other | Admitting: Physical Therapy

## 2013-11-10 ENCOUNTER — Encounter: Payer: PRIVATE HEALTH INSURANCE | Admitting: Physical Therapy

## 2013-11-21 ENCOUNTER — Ambulatory Visit: Payer: Medicare Other | Admitting: Physical Therapy

## 2013-12-01 ENCOUNTER — Ambulatory Visit: Payer: Medicare Other | Attending: Internal Medicine | Admitting: Physical Therapy

## 2013-12-01 DIAGNOSIS — F0390 Unspecified dementia without behavioral disturbance: Secondary | ICD-10-CM | POA: Insufficient documentation

## 2013-12-01 DIAGNOSIS — I10 Essential (primary) hypertension: Secondary | ICD-10-CM | POA: Insufficient documentation

## 2013-12-01 DIAGNOSIS — IMO0001 Reserved for inherently not codable concepts without codable children: Secondary | ICD-10-CM | POA: Insufficient documentation

## 2013-12-01 DIAGNOSIS — R269 Unspecified abnormalities of gait and mobility: Secondary | ICD-10-CM | POA: Insufficient documentation

## 2013-12-08 ENCOUNTER — Ambulatory Visit: Payer: Medicare Other | Admitting: Physical Therapy

## 2013-12-15 ENCOUNTER — Ambulatory Visit: Payer: Medicare Other | Admitting: Physical Therapy

## 2013-12-22 ENCOUNTER — Ambulatory Visit: Payer: Medicare Other | Admitting: Physical Therapy

## 2014-04-10 ENCOUNTER — Ambulatory Visit: Payer: Medicare Other | Attending: Internal Medicine | Admitting: Physical Therapy

## 2014-04-10 DIAGNOSIS — IMO0001 Reserved for inherently not codable concepts without codable children: Secondary | ICD-10-CM | POA: Insufficient documentation

## 2014-04-10 DIAGNOSIS — R269 Unspecified abnormalities of gait and mobility: Secondary | ICD-10-CM | POA: Diagnosis not present

## 2014-04-13 ENCOUNTER — Ambulatory Visit: Payer: Medicare Other | Admitting: Physical Therapy

## 2014-04-13 DIAGNOSIS — IMO0001 Reserved for inherently not codable concepts without codable children: Secondary | ICD-10-CM | POA: Diagnosis not present

## 2014-04-18 ENCOUNTER — Ambulatory Visit: Payer: Medicare Other | Admitting: Physical Therapy

## 2014-04-18 DIAGNOSIS — IMO0001 Reserved for inherently not codable concepts without codable children: Secondary | ICD-10-CM | POA: Diagnosis not present

## 2014-04-20 ENCOUNTER — Encounter: Payer: Medicare Other | Admitting: Physical Therapy

## 2014-04-24 ENCOUNTER — Ambulatory Visit: Payer: Medicare Other | Admitting: Physical Therapy

## 2014-04-24 DIAGNOSIS — IMO0001 Reserved for inherently not codable concepts without codable children: Secondary | ICD-10-CM | POA: Diagnosis not present

## 2014-04-27 ENCOUNTER — Ambulatory Visit: Payer: Medicare Other | Attending: Internal Medicine | Admitting: Physical Therapy

## 2014-04-27 DIAGNOSIS — IMO0001 Reserved for inherently not codable concepts without codable children: Secondary | ICD-10-CM | POA: Diagnosis present

## 2014-04-27 DIAGNOSIS — R269 Unspecified abnormalities of gait and mobility: Secondary | ICD-10-CM | POA: Insufficient documentation

## 2014-05-02 ENCOUNTER — Encounter: Payer: Medicare Other | Admitting: Physical Therapy

## 2014-05-04 ENCOUNTER — Ambulatory Visit: Payer: Medicare Other | Admitting: Physical Therapy

## 2014-05-04 DIAGNOSIS — IMO0001 Reserved for inherently not codable concepts without codable children: Secondary | ICD-10-CM | POA: Diagnosis not present

## 2014-05-08 ENCOUNTER — Ambulatory Visit: Payer: Medicare Other | Admitting: Physical Therapy

## 2014-05-08 DIAGNOSIS — IMO0001 Reserved for inherently not codable concepts without codable children: Secondary | ICD-10-CM | POA: Diagnosis not present

## 2014-05-11 ENCOUNTER — Ambulatory Visit: Payer: Medicare Other | Admitting: Physical Therapy

## 2014-06-08 ENCOUNTER — Encounter: Payer: Medicare Other | Admitting: Physical Therapy

## 2014-06-12 ENCOUNTER — Ambulatory Visit: Payer: Medicare Other | Attending: Internal Medicine | Admitting: Physical Therapy

## 2014-06-12 DIAGNOSIS — M25561 Pain in right knee: Secondary | ICD-10-CM | POA: Diagnosis not present

## 2014-06-12 DIAGNOSIS — R269 Unspecified abnormalities of gait and mobility: Secondary | ICD-10-CM | POA: Insufficient documentation

## 2014-06-12 DIAGNOSIS — M25562 Pain in left knee: Secondary | ICD-10-CM | POA: Insufficient documentation

## 2014-06-12 DIAGNOSIS — F0390 Unspecified dementia without behavioral disturbance: Secondary | ICD-10-CM | POA: Insufficient documentation

## 2014-06-12 DIAGNOSIS — I10 Essential (primary) hypertension: Secondary | ICD-10-CM | POA: Diagnosis not present

## 2014-09-09 ENCOUNTER — Emergency Department (HOSPITAL_COMMUNITY)
Admission: EM | Admit: 2014-09-09 | Discharge: 2014-09-09 | Disposition: A | Discharge status: Home / Self Care | Payer: Medicare Other | Attending: Emergency Medicine | Admitting: Emergency Medicine

## 2014-09-09 ENCOUNTER — Emergency Department (HOSPITAL_COMMUNITY): Payer: Medicare Other

## 2014-09-09 ENCOUNTER — Encounter (HOSPITAL_COMMUNITY): Payer: Self-pay | Admitting: Emergency Medicine

## 2014-09-09 DIAGNOSIS — F0390 Unspecified dementia without behavioral disturbance: Secondary | ICD-10-CM | POA: Diagnosis not present

## 2014-09-09 DIAGNOSIS — M25552 Pain in left hip: Secondary | ICD-10-CM | POA: Insufficient documentation

## 2014-09-09 DIAGNOSIS — I10 Essential (primary) hypertension: Secondary | ICD-10-CM | POA: Insufficient documentation

## 2014-09-09 DIAGNOSIS — K219 Gastro-esophageal reflux disease without esophagitis: Secondary | ICD-10-CM | POA: Diagnosis not present

## 2014-09-09 DIAGNOSIS — Z79899 Other long term (current) drug therapy: Secondary | ICD-10-CM | POA: Diagnosis not present

## 2014-09-09 DIAGNOSIS — R52 Pain, unspecified: Secondary | ICD-10-CM

## 2014-09-09 DIAGNOSIS — Z7982 Long term (current) use of aspirin: Secondary | ICD-10-CM | POA: Diagnosis not present

## 2014-09-09 MED ORDER — OXYCODONE-ACETAMINOPHEN 5-325 MG PO TABS
1.0000 | ORAL_TABLET | ORAL | Status: DC | PRN
Start: 2014-09-09 — End: 2015-05-21

## 2014-09-09 MED ORDER — OXYCODONE-ACETAMINOPHEN 5-325 MG PO TABS
1.0000 | ORAL_TABLET | Freq: Once | ORAL | Status: AC
  Administered 2014-09-09: 1 via ORAL
  Filled 2014-09-09: qty 1

## 2014-09-09 NOTE — ED Notes (Signed)
Patient transported to X-ray 

## 2014-09-09 NOTE — ED Provider Notes (Signed)
CSN: [id number]     Arrival date & time 09/09/14  1506 History   First MD Initiated Contact with Patient 09/09/14 1615     Chief Complaint  Patient presents with  . Hip Pain     (Consider location/radiation/quality/duration/timing/severity/associated sxs/prior Treatment) The history is provided by the patient, the spouse and a relative.     Pt presents with acute onset sharp left hip pain that began upon trying to stand today, just after noon.  Pain now occurs only with attempting to bear weight.  Denies radiation of pain, denies weakness or numbness of the leg.  She has chronic generalized weakness but no chronic joint or back pain.  Fell approximately two weeks ago but has not had any pain since.  Pt given tylenol and ice at home.    Past Medical History  Diagnosis Date  . Dementia   . Hypertension   . GERD (gastroesophageal reflux disease)    Past Surgical History  Procedure Laterality Date  . Cholecystectomy N/A 01/03/2013    Procedure: LAPAROSCOPIC CHOLECYSTECTOMY WITH INTRAOPERATIVE CHOLANGIOGRAM;  Surgeon: [name], MD;  Location: [facility] OR;  Service: General;  Laterality: N/A;   No family history on file. History  Substance Use Topics  . Smoking status: Never Smoker   . Smokeless tobacco: Not on file  . Alcohol Use: No   OB History    No data available     Review of Systems  Constitutional: Negative for fever and chills.  Musculoskeletal: Positive for arthralgias. Negative for back pain.  Skin: Negative for color change and wound.  Allergic/Immunologic: Negative for immunocompromised state.  Neurological: Negative for syncope, weakness and numbness.      Allergies  Review of patient's allergies indicates no known allergies.  Home Medications   Prior to Admission medications   Medication Sig Start Date End Date Taking? Authorizing Provider  amoxicillin-clavulanate (AUGMENTIN) 875-125 MG per tablet Take 1 tablet by mouth every 12 (twelve) hours. 01/05/13    Barnetta Chapel, PA-C  aspirin EC 81 MG tablet Take 81 mg by mouth daily.    Historical Provider, MD  atenolol-chlorthalidone (TENORETIC) 50-25 MG per tablet Take 1 tablet by mouth daily.    Historical Provider, MD  buPROPion (WELLBUTRIN SR) 150 MG 12 hr tablet Take 150 mg by mouth daily.    Historical Provider, MD  celecoxib (CELEBREX) 200 MG capsule Take 200 mg by mouth daily.    Historical Provider, MD  clotrimazole-betamethasone (LOTRISONE) cream Apply 1 application topically 2 (two) times daily as needed (for rash).    Historical Provider, MD  cyclobenzaprine (FLEXERIL) 10 MG tablet Take 10 mg by mouth at bedtime.    Historical Provider, MD  donepezil (ARICEPT) 10 MG tablet Take 10 mg by mouth at bedtime.    Historical Provider, MD  estrogens, conjugated, (PREMARIN) 0.625 MG tablet Take 0.625 mg by mouth daily.    Historical Provider, MD  gabapentin (NEURONTIN) 300 MG capsule Take 300 mg by mouth daily.    Historical Provider, MD  GLUCOSAMINE HCL PO Take 1,000 mg by mouth daily.    Historical Provider, MD  memantine (NAMENDA) 10 MG tablet Take 10 mg by mouth 2 (two) times daily.    Historical Provider, MD  omeprazole (PRILOSEC) 20 MG capsule Take 20 mg by mouth daily.    Historical Provider, MD  oxyCODONE (OXY IR/ROXICODONE) 5 MG immediate release tablet Take 1-2 tablets (5-10 mg total) by mouth every 4 (four) hours as needed. 01/05/13   Barnetta Chapel,  PA-C  Vilazodone HCl (VIIBRYD) 40 MG TABS Take 40 mg by mouth daily.    Historical Provider, MD  zolpidem (AMBIEN) 10 MG tablet Take 10 mg by mouth at bedtime as needed for sleep.    Historical Provider, MD   BP 118/53 mmHg  Pulse 50  Temp(Src) 97.9 F (36.6 C) (Oral)  Resp 18  SpO2 93% Physical Exam  Constitutional: She appears well-developed and well-nourished. No distress.  HENT:  Head: Normocephalic and atraumatic.  Neck: Neck supple.  Pulmonary/Chest: Effort normal.  Abdominal: Soft. She exhibits no distension and no mass. There is  no tenderness. There is no rebound and no guarding.  Musculoskeletal:       Legs: Full passive ROM of left hip without pain.  Full strength with forward flexion.  Distal pulses and sensation intact.   Spine nontender, no crepitus, or stepoffs.   Lower extremities:  Strength 5/5, sensation intact, distal pulses intact.      Neurological: She is alert.  Skin: She is not diaphoretic.  Nursing note and vitals reviewed.   ED Course  Procedures (including critical care time) Labs Review Labs Reviewed - No data to display  Imaging Review Ct Hip Left Wo Contrast  09/09/2014   CLINICAL DATA:  LEFT hip pain with no injury, sharp pain when walking that makes her unable to bear weight on LEFT leg/hip  EXAM: CT OF THE LEFT HIP WITHOUT CONTRAST  TECHNIQUE: Multidetector CT imaging of the left hip was performed according to the standard protocol. Multiplanar CT image reconstructions were also generated.  COMPARISON:  LEFT hip radiographs 09/09/2014  FINDINGS: Bones appear demineralized.  Minimal narrowing of RIGHT hip joint.  Visualized RIGHT hemipelvis appears intact.  Pubic symphysis unremarkable.  No fracture, dislocation, or bone destruction.  Scattered atherosclerotic calcifications.  LEFT hip region soft tissues and visualized intrapelvic soft tissues unremarkable.  No CT evidence of a LEFT hip joint effusion.  IMPRESSION: No acute osseous or soft tissue abnormalities.   Electronically Signed   By: Ulyses SouthwardMark  Boles M.D.   On: 09/09/2014 18:48   Dg Hip Unilat With Pelvis Min 4 Views Left  09/09/2014   CLINICAL DATA:  Left hip pain today.  No known injury.  EXAM: DG HIP W/ PWLVIS 4+V*L*  COMPARISON:  None.  FINDINGS: Early spurring in the hip joints bilaterally, symmetric. SI joints are symmetric and unremarkable. No acute bony abnormality. Specifically, no fracture, subluxation, or dislocation. Soft tissues are intact.  IMPRESSION: No acute bony abnormality.   Electronically Signed   By: Charlett NoseKevin  Dover M.D.    On: 09/09/2014 17:26     EKG Interpretation None       Pt declines pain medication here.    5:47 PM Xray negative.  Unable to bear weight secondary to pain.  Percocet, CT hip ordered.  Pt also seen and examined by Dr Anitra LauthPlunkett.   7:14 PM Pt reports great improvement after percocet.  I had a long discussion with patient and husband, family members.  They decline home health, they have 5 children and feel    MDM   Final diagnoses:  Pain  Left hip pain    Afebrile, nontoxic patient with left hip pain without fall or trauma, occurred with standing while at home.  Xray and CT negative.  Pain improved with percocet.  Neurovascularly intact.  No back pain or tenderness.  Pt and family declined home health, PT, etc.  Have plenty of help at home and would like to follow up  with PCP.   D/C home with percocet, PCP follow up.  Discussed result, findings, treatment, and follow up  with patient.  Pt given return precautions.  Pt verbalizes understanding and agrees with plan.         Trixie Dredge, PA-C 09/09/14 2049  Gwyneth Sprout, MD 09/09/14 2337

## 2014-09-09 NOTE — ED Notes (Signed)
Pt from home c/o left hip pain. She reports that she stood up and felt a sharp pain and then was unable to put any weight on her leg. No recent injury.

## 2014-09-09 NOTE — ED Notes (Addendum)
Pt c/o L hip pain with no injury. Pt sts that she has sharp pain when walking that makes her unable to bear weight on L leg/hip. Pt denies pain while lying flat. Pt is able to lift legs bilaterally off of bed with no difficulty or pain. Pt in NAD

## 2014-09-09 NOTE — Discharge Instructions (Signed)

## 2014-10-26 ENCOUNTER — Emergency Department (HOSPITAL_COMMUNITY): Payer: Medicare Other

## 2014-10-26 ENCOUNTER — Emergency Department (HOSPITAL_COMMUNITY)
Admission: EM | Admit: 2014-10-26 | Discharge: 2014-10-26 | Disposition: A | Discharge status: Home / Self Care | Payer: Medicare Other | Attending: Emergency Medicine | Admitting: Emergency Medicine

## 2014-10-26 ENCOUNTER — Encounter (HOSPITAL_COMMUNITY): Payer: Self-pay | Admitting: Emergency Medicine

## 2014-10-26 DIAGNOSIS — S7001XA Contusion of right hip, initial encounter: Secondary | ICD-10-CM | POA: Insufficient documentation

## 2014-10-26 DIAGNOSIS — W19XXXA Unspecified fall, initial encounter: Secondary | ICD-10-CM

## 2014-10-26 DIAGNOSIS — I10 Essential (primary) hypertension: Secondary | ICD-10-CM | POA: Insufficient documentation

## 2014-10-26 DIAGNOSIS — Y92008 Other place in unspecified non-institutional (private) residence as the place of occurrence of the external cause: Secondary | ICD-10-CM | POA: Diagnosis not present

## 2014-10-26 DIAGNOSIS — Z791 Long term (current) use of non-steroidal anti-inflammatories (NSAID): Secondary | ICD-10-CM | POA: Diagnosis not present

## 2014-10-26 DIAGNOSIS — F0390 Unspecified dementia without behavioral disturbance: Secondary | ICD-10-CM | POA: Diagnosis not present

## 2014-10-26 DIAGNOSIS — Y9389 Activity, other specified: Secondary | ICD-10-CM | POA: Insufficient documentation

## 2014-10-26 DIAGNOSIS — W1839XA Other fall on same level, initial encounter: Secondary | ICD-10-CM | POA: Diagnosis not present

## 2014-10-26 DIAGNOSIS — K219 Gastro-esophageal reflux disease without esophagitis: Secondary | ICD-10-CM | POA: Insufficient documentation

## 2014-10-26 DIAGNOSIS — Y998 Other external cause status: Secondary | ICD-10-CM | POA: Insufficient documentation

## 2014-10-26 DIAGNOSIS — R52 Pain, unspecified: Secondary | ICD-10-CM

## 2014-10-26 DIAGNOSIS — Z79899 Other long term (current) drug therapy: Secondary | ICD-10-CM | POA: Insufficient documentation

## 2014-10-26 DIAGNOSIS — S79911A Unspecified injury of right hip, initial encounter: Secondary | ICD-10-CM | POA: Diagnosis present

## 2014-10-26 LAB — BASIC METABOLIC PANEL
Anion gap: 7 (ref 5–15)
BUN: 19 mg/dL (ref 6–23)
CO2: 29 mmol/L (ref 19–32)
Calcium: 8.8 mg/dL (ref 8.4–10.5)
Chloride: 103 mmol/L (ref 96–112)
Creatinine, Ser: 0.99 mg/dL (ref 0.50–1.10)
GFR calc Af Amer: 61 mL/min — ABNORMAL LOW (ref >90)
GFR calc non Af Amer: 52 mL/min — ABNORMAL LOW (ref >90)
Glucose, Bld: 92 mg/dL (ref 70–99)
Potassium: 3.7 mmol/L (ref 3.5–5.1)
Sodium: 139 mmol/L (ref 135–145)

## 2014-10-26 LAB — CBC WITH DIFFERENTIAL/PLATELET
Basophils Absolute: 0 10*3/uL (ref 0.0–0.1)
Basophils Relative: 0 % (ref 0–1)
Eosinophils Absolute: 0.2 10*3/uL (ref 0.0–0.7)
Eosinophils Relative: 2 % (ref 0–5)
HCT: 40.1 % (ref 36.0–46.0)
Hemoglobin: 13.2 g/dL (ref 12.0–15.0)
Lymphocytes Relative: 30 % (ref 12–46)
Lymphs Abs: 2.3 10*3/uL (ref 0.7–4.0)
MCH: 30.8 pg (ref 26.0–34.0)
MCHC: 32.9 g/dL (ref 30.0–36.0)
MCV: 93.7 fL (ref 78.0–100.0)
Monocytes Absolute: 0.6 10*3/uL (ref 0.1–1.0)
Monocytes Relative: 8 % (ref 3–12)
Neutro Abs: 4.5 10*3/uL (ref 1.7–7.7)
Neutrophils Relative %: 60 % (ref 43–77)
Platelets: 207 10*3/uL (ref 150–400)
RBC: 4.28 MIL/uL (ref 3.87–5.11)
RDW: 13.8 % (ref 11.5–15.5)
WBC: 7.6 10*3/uL (ref 4.0–10.5)

## 2014-10-26 MED ORDER — HYDROCODONE-ACETAMINOPHEN 5-325 MG PO TABS
1.0000 | ORAL_TABLET | Freq: Once | ORAL | Status: AC
  Administered 2014-10-26: 1 via ORAL
  Filled 2014-10-26: qty 1

## 2014-10-26 MED ORDER — HYDROCODONE-ACETAMINOPHEN 5-325 MG PO TABS: 1.0000 | ORAL_TABLET | Freq: Four times a day (QID) | ORAL | Status: DC | PRN

## 2014-10-26 NOTE — Discharge Instructions (Signed)
Your CT scan was negative.  Return here as needed.  Follow-up with your primary care doctor

## 2014-10-26 NOTE — Progress Notes (Signed)
Pt confirms she has walker, cane, crutches at home and DME not needed

## 2014-10-26 NOTE — ED Provider Notes (Signed)
Medical screening examination/treatment/procedure(s) were conducted as a shared visit with non-physician practitioner(s) and myself.  I personally evaluated the patient during the encounter.   EKG Interpretation None     Patient seen examined. Complains of pain to her right hip. No shortening or rotation noted. Will obtain imaging.  Toy BakerAnthony T Kiwan Gadsden, MD 10/26/14 [phone]

## 2014-10-26 NOTE — ED Notes (Signed)
Per EMS pt fell around 2100 last night. Was trying to sit in chair and missed it. Pt and husband state that it didn't hurt at the time of the fall, maintained good movement. Woke up this morning with right hip pain with movement. Denies neck/back pain, LOC

## 2014-10-26 NOTE — ED Notes (Signed)
Patient transported to MRI 

## 2014-10-26 NOTE — ED Provider Notes (Signed)
CSN: [id number]     Arrival date & time 10/26/14  4098 History   First MD Initiated Contact with Patient 10/26/14 [phone]     Chief Complaint  Patient presents with  . Fall  . Hip Pain    Right hip     (Consider location/radiation/quality/duration/timing/severity/associated sxs/prior Treatment) HPI Patient presents to the emergency department with pain following a fall that occurred last night.  The patient has right hip pain following a fall.  Patient states that she did not hit her head or lose consciousness.  The husband stated it was a witnessed fall while she was trying to get into a chair.  The patient denies chest pain, shortness of breath, headache, blurred vision, weakness, dizziness, back pain, neck pain, abdominal pain, nausea, vomiting or loss of consciousness.  Patient states that movement and palpation make the pain worse  Past Medical History  Diagnosis Date  . Dementia   . Hypertension   . GERD (gastroesophageal reflux disease)    Past Surgical History  Procedure Laterality Date  . Cholecystectomy N/A 01/03/2013    Procedure: LAPAROSCOPIC CHOLECYSTECTOMY WITH INTRAOPERATIVE CHOLANGIOGRAM;  Surgeon: [name], MD;  Location: [facility] OR;  Service: General;  Laterality: N/A;   History reviewed. No pertinent family history. History  Substance Use Topics  . Smoking status: Never Smoker   . Smokeless tobacco: Not on file  . Alcohol Use: No   OB History    No data available     Review of Systems All other systems negative except as documented in the HPI. All pertinent positives and negatives as reviewed in the HPI.   Allergies  Review of patient's allergies indicates no known allergies.  Home Medications   Prior to Admission medications   Medication Sig Start Date End Date Taking? Authorizing Provider  acetaminophen (TYLENOL) 500 MG tablet Take 1,000 mg by mouth every 6 (six) hours as needed for mild pain or headache.   Yes Historical Provider, MD   atenolol-chlorthalidone (TENORETIC) 50-25 MG per tablet Take 1 tablet by mouth daily.   Yes Historical Provider, MD  buPROPion (WELLBUTRIN SR) 150 MG 12 hr tablet Take 150 mg by mouth daily.   Yes Historical Provider, MD  celecoxib (CELEBREX) 200 MG capsule Take 200 mg by mouth daily.   Yes Historical Provider, MD  cyclobenzaprine (FLEXERIL) 10 MG tablet Take 10 mg by mouth 3 times/day as needed-between meals & bedtime for muscle spasms.    Yes Historical Provider, MD  donepezil (ARICEPT) 10 MG tablet Take 10 mg by mouth daily.    Yes Historical Provider, MD  estrogens, conjugated, (PREMARIN) 0.625 MG tablet Take 0.625 mg by mouth daily.   Yes Historical Provider, MD  gabapentin (NEURONTIN) 300 MG capsule Take 300 mg by mouth at bedtime.    Yes Historical Provider, MD  memantine (NAMENDA) 10 MG tablet Take 10 mg by mouth 2 (two) times daily.   Yes Historical Provider, MD  omeprazole (PRILOSEC) 20 MG capsule Take 20 mg by mouth daily.   Yes Historical Provider, MD  QUEtiapine (SEROQUEL) 25 MG tablet Take 25 mg by mouth at bedtime.   Yes Historical Provider, MD  Vilazodone HCl (VIIBRYD) 40 MG TABS Take 40 mg by mouth daily.   Yes Historical Provider, MD  zolpidem (AMBIEN) 10 MG tablet Take 10 mg by mouth at bedtime as needed for sleep.   Yes Historical Provider, MD  amoxicillin-clavulanate (AUGMENTIN) 875-125 MG per tablet Take 1 tablet by mouth every 12 (twelve) hours. Patient  not taking: Reported on 09/09/2014 01/05/13   Barnetta ChapelKelly Osborne, PA-C  oxyCODONE (OXY IR/ROXICODONE) 5 MG immediate release tablet Take 1-2 tablets (5-10 mg total) by mouth every 4 (four) hours as needed. Patient not taking: Reported on 09/09/2014 01/05/13   Barnetta ChapelKelly Osborne, PA-C  oxyCODONE-acetaminophen (PERCOCET/ROXICET) 5-325 MG per tablet Take 1-2 tablets by mouth every 4 (four) hours as needed for moderate pain or severe pain. Patient not taking: Reported on 10/26/2014 09/09/14   Trixie DredgeEmily West, PA-C   BP 139/50 mmHg  Pulse 44   Temp(Src) 97.5 F (36.4 C)  Resp 16  SpO2 94% Physical Exam  Constitutional: She is oriented to person, place, and time. She appears well-developed and well-nourished. No distress.  HENT:  Head: Normocephalic and atraumatic.  Mouth/Throat: Oropharynx is clear and moist.  Eyes: Pupils are equal, round, and reactive to light.  Neck: Normal range of motion. Neck supple.  Cardiovascular: Normal rate, regular rhythm and normal heart sounds.  Exam reveals no gallop and no friction rub.   No murmur heard. Pulmonary/Chest: Effort normal and breath sounds normal. No respiratory distress.  Neurological: She is alert and oriented to person, place, and time. She exhibits normal muscle tone. Coordination normal.  Skin: Skin is warm and dry. No rash noted. No erythema.  Nursing note and vitals reviewed.   ED Course  Procedures (including critical care time) Labs Review Labs Reviewed  BASIC METABOLIC PANEL - Abnormal; Notable for the following:    GFR calc non Af Amer 52 (*)    GFR calc Af Amer 61 (*)    All other components within normal limits  CBC WITH DIFFERENTIAL/PLATELET    Imaging Review Ct Hip Right Wo Contrast  10/26/2014   CLINICAL DATA:  Right hip pain since a fall last night.  EXAM: CT OF THE RIGHT HIP WITHOUT CONTRAST  TECHNIQUE: Multidetector CT imaging of the right hip was performed according to the standard protocol. Multiplanar CT image reconstructions were also generated.  COMPARISON:  Radiographs dated 10/26/2014  FINDINGS: The osseous structures of the right hip appear normal. There is no joint effusion. Minimal edema in the subcutaneous fat lateral to the right hip which may represent soft tissue contusion.  IMPRESSION: No acute osseous abnormality.  Minimal soft tissue contusion.   Electronically Signed   By: Francene BoyersJames  Maxwell M.D.   On: 10/26/2014 12:39   Dg Hip Unilat With Pelvis 2-3 Views Right  10/26/2014   CLINICAL DATA:  Fall yesterday.  RIGHT posterior hip pain.  EXAM:  RIGHT HIP (WITH PELVIS) 2-3 VIEWS  COMPARISON:  None.  FINDINGS: Atherosclerosis.  Lower lumbar spondylosis.  There is mild narrowing of the RIGHT hip joint space. There is no pelvic fracture. The obturator rings appear intact. Sacral arcades appear normal. RIGHT femoral neck appears normal.  IMPRESSION: No acute osseous abnormality.  Mild RIGHT hip osteoarthritis.   Electronically Signed   By: Andreas NewportGeoffrey  Lamke M.D.   On: 10/26/2014 10:56   Patient's MR of her hip was negative.  Patient will be treated for pain.  Patient be referred back to her primary care Dr. told to use ice and heat on her hip   MDM   Final diagnoses:  Fall       Carlyle DollyChristopher W Maverik Foot, PA-C 10/26/14 1533

## 2014-10-26 NOTE — Progress Notes (Addendum)
Updated pcp as Geoffry Paradiserichard Aronson  Husband request pcp be notified Cm notified Dr Jacky KindleAronson

## 2014-10-26 NOTE — ED Notes (Signed)
Ebbie Ridgehris Lawyer PA made aware of pt's vitals at discharge

## 2014-10-26 NOTE — ED Notes (Signed)
Bed: WA06 Expected date:  Expected time:  Means of arrival:  Comments: Ems,fall hip pain

## 2014-10-26 NOTE — ED Notes (Signed)
Pt ambulated with assistance but was in a lot of pain

## 2014-10-26 NOTE — ED Notes (Signed)
Will update vitals when pt returns from CT scan

## 2015-02-25 ENCOUNTER — Emergency Department (HOSPITAL_COMMUNITY)
Admission: EM | Admit: 2015-02-25 | Discharge: 2015-02-28 | Disposition: A | Discharge status: Other Acute Inpatient Hospital | Payer: Medicare Other | Attending: Emergency Medicine | Admitting: Emergency Medicine

## 2015-02-25 ENCOUNTER — Encounter (HOSPITAL_COMMUNITY): Payer: Self-pay | Admitting: *Deleted

## 2015-02-25 DIAGNOSIS — F0391 Unspecified dementia with behavioral disturbance: Secondary | ICD-10-CM | POA: Diagnosis not present

## 2015-02-25 DIAGNOSIS — I10 Essential (primary) hypertension: Secondary | ICD-10-CM | POA: Diagnosis not present

## 2015-02-25 DIAGNOSIS — K219 Gastro-esophageal reflux disease without esophagitis: Secondary | ICD-10-CM | POA: Insufficient documentation

## 2015-02-25 DIAGNOSIS — N390 Urinary tract infection, site not specified: Secondary | ICD-10-CM

## 2015-02-25 DIAGNOSIS — F0390 Unspecified dementia without behavioral disturbance: Secondary | ICD-10-CM | POA: Insufficient documentation

## 2015-02-25 DIAGNOSIS — F03918 Unspecified dementia, unspecified severity, with other behavioral disturbance: Secondary | ICD-10-CM | POA: Diagnosis present

## 2015-02-25 DIAGNOSIS — Z79899 Other long term (current) drug therapy: Secondary | ICD-10-CM | POA: Insufficient documentation

## 2015-02-25 DIAGNOSIS — Z791 Long term (current) use of non-steroidal anti-inflammatories (NSAID): Secondary | ICD-10-CM | POA: Insufficient documentation

## 2015-02-25 DIAGNOSIS — J189 Pneumonia, unspecified organism: Secondary | ICD-10-CM

## 2015-02-25 LAB — URINALYSIS, ROUTINE W REFLEX MICROSCOPIC
Bilirubin Urine: NEGATIVE
Glucose, UA: NEGATIVE mg/dL
Hgb urine dipstick: NEGATIVE
Ketones, ur: NEGATIVE mg/dL
Nitrite: POSITIVE — AB
Protein, ur: NEGATIVE mg/dL
Specific Gravity, Urine: 1.018 (ref 1.005–1.030)
Urobilinogen, UA: 0.2 mg/dL (ref 0.0–1.0)
pH: 5.5 (ref 5.0–8.0)

## 2015-02-25 LAB — RAPID URINE DRUG SCREEN, HOSP PERFORMED
Amphetamines: NOT DETECTED
Barbiturates: NOT DETECTED
Benzodiazepines: NOT DETECTED
Cocaine: NOT DETECTED
Opiates: NOT DETECTED
Tetrahydrocannabinol: NOT DETECTED

## 2015-02-25 LAB — CBC WITH DIFFERENTIAL/PLATELET
Basophils Absolute: 0 10*3/uL (ref 0.0–0.1)
Basophils Relative: 0 % (ref 0–1)
Eosinophils Absolute: 0.1 10*3/uL (ref 0.0–0.7)
Eosinophils Relative: 2 % (ref 0–5)
HCT: 39.8 % (ref 36.0–46.0)
Hemoglobin: 13 g/dL (ref 12.0–15.0)
Lymphocytes Relative: 43 % (ref 12–46)
Lymphs Abs: 2.8 10*3/uL (ref 0.7–4.0)
MCH: 30.7 pg (ref 26.0–34.0)
MCHC: 32.7 g/dL (ref 30.0–36.0)
MCV: 93.9 fL (ref 78.0–100.0)
Monocytes Absolute: 0.5 10*3/uL (ref 0.1–1.0)
Monocytes Relative: 7 % (ref 3–12)
Neutro Abs: 3.1 10*3/uL (ref 1.7–7.7)
Neutrophils Relative %: 48 % (ref 43–77)
Platelets: 232 10*3/uL (ref 150–400)
RBC: 4.24 MIL/uL (ref 3.87–5.11)
RDW: 13.6 % (ref 11.5–15.5)
WBC: 6.5 10*3/uL (ref 4.0–10.5)

## 2015-02-25 LAB — COMPREHENSIVE METABOLIC PANEL
ALT: 12 U/L — ABNORMAL LOW (ref 14–54)
AST: 19 U/L (ref 15–41)
Albumin: 3.8 g/dL (ref 3.5–5.0)
Alkaline Phosphatase: 84 U/L (ref 38–126)
Anion gap: 11 (ref 5–15)
BUN: 18 mg/dL (ref 6–20)
CO2: 30 mmol/L (ref 22–32)
Calcium: 9.3 mg/dL (ref 8.9–10.3)
Chloride: 99 mmol/L — ABNORMAL LOW (ref 101–111)
Creatinine, Ser: 1.07 mg/dL — ABNORMAL HIGH (ref 0.44–1.00)
GFR calc Af Amer: 55 mL/min — ABNORMAL LOW (ref >60)
GFR calc non Af Amer: 48 mL/min — ABNORMAL LOW (ref >60)
Glucose, Bld: 111 mg/dL — ABNORMAL HIGH (ref 65–99)
Potassium: 3.1 mmol/L — ABNORMAL LOW (ref 3.5–5.1)
Sodium: 140 mmol/L (ref 135–145)
Total Bilirubin: 0.3 mg/dL (ref 0.3–1.2)
Total Protein: 7.4 g/dL (ref 6.5–8.1)

## 2015-02-25 LAB — URINE MICROSCOPIC-ADD ON

## 2015-02-25 LAB — ETHANOL: Alcohol, Ethyl (B): <5 mg/dL (ref <5)

## 2015-02-25 MED ORDER — LORAZEPAM 2 MG/ML IJ SOLN
2.0000 mg | Freq: Once | INTRAMUSCULAR | Status: AC
  Administered 2015-02-25: 2 mg via INTRAMUSCULAR

## 2015-02-25 MED ORDER — LORAZEPAM 2 MG/ML IJ SOLN
INTRAMUSCULAR | Status: AC
Start: 2015-02-25 — End: 2015-02-26
  Filled 2015-02-25: qty 1

## 2015-02-25 MED ORDER — CEPHALEXIN 500 MG PO CAPS
500.0000 mg | ORAL_CAPSULE | Freq: Two times a day (BID) | ORAL | Status: DC
  Administered 2015-02-25 – 2015-02-28 (×6): 500 mg via ORAL
  Filled 2015-02-25 (×6): qty 1

## 2015-02-25 NOTE — ED Notes (Signed)
Pt became combative and verbally aggressive.  Pt was moved to RESA to be monitored more closely as well as have a sitter for safety.  Trey PaulaJeff, PA at bedside, pt placed in restraints as she continued to be aggressive striking at staff and throwing her shoes at the PA.  Pt given 2 mg IM ativan in an attempt to calm pt.  Less aggressive measures taken prior to initiating restraints such as therapeutic communication and reduction of environment stimulation w/o success.

## 2015-02-25 NOTE — BH Assessment (Addendum)
Tele Assessment Note   Pamela Mileslizabeth A Byer is an 81 y.o. female, married, white who was brought to [facility] ED via EMS. Per report by husband, Pt has a history of dementia and symptoms have progressively worsened. Today Pt was telling her husband that she wanted to leave the house, that he she was not his wife and that he was holding her hostage. She insisted on leaving the house and was screaming for help and trying to contact neighbors. Pt laid down on the ground and Pt's husband called EMS. Law enforcement instructed Pt's husband to petition for IVC. Per ED notes Pt became combative and verbally aggressive and had to be restrained.  Pt was awakened for assessment. Pt's hair is disheveled and she is dressed in a hospital gown. She is drowsy but became fully awake. She is not oriented to location, date, time or situation. Speech is clear and eye contact is good. Pt's mood is pleasant and affect is appropriate to circumstance. Thought process is incoherent with flight of ideas. Pt is a very poor historian and is unable to answer most questions appropriately. She is unable to say where she lives or who lives with her. Recent and remote memory is significantly impaired. There is no indication Pt is currently responding to internal stimuli.    Axis I: Major Neurocognitive Disorder Axis II: Deferred Axis III:  Past Medical History  Diagnosis Date  . Dementia   . Hypertension   . GERD (gastroesophageal reflux disease)    Axis IV: other psychosocial or environmental problems and problems with access to health care services Axis V: GAF=20  Past Medical History:  Past Medical History  Diagnosis Date  . Dementia   . Hypertension   . GERD (gastroesophageal reflux disease)     Past Surgical History  Procedure Laterality Date  . Cholecystectomy N/A 01/03/2013    Procedure: LAPAROSCOPIC CHOLECYSTECTOMY WITH INTRAOPERATIVE CHOLANGIOGRAM;  Surgeon: Liz MaladyBurke E Thompson, MD;  Location: [facility] OR;  Service:  General;  Laterality: N/A;    Family History: No family history on file.  Social History:  reports that she has never smoked. She does not have any smokeless tobacco history on file. She reports that she does not drink alcohol. Her drug history is not on file.  Additional Social History:  Alcohol / Drug Use Pain Medications: None Prescriptions: See MAR Over the Counter: None History of alcohol / drug use?: No history of alcohol / drug abuse Longest period of sobriety (when/how long): NA  CIWA: CIWA-Ar BP: 127/62 mmHg Pulse Rate: (!) 50 COWS:    PATIENT STRENGTHS: (choose at least two) Average or above average intelligence Physical Health Supportive family/friends  Allergies: No Known Allergies  Home Medications:  (Not in a hospital admission)  OB/GYN Status:  No LMP recorded. Patient is postmenopausal.  General Assessment Data Location of Assessment: [facility] ED TTS Assessment: In system Is this a Tele or Face-to-Face Assessment?: Face-to-Face Is this an Initial Assessment or a Re-assessment for this encounter?: Initial Assessment Marital status: Married [place]: Unknown Is patient pregnant?: No Pregnancy Status: No Living Arrangements: Spouse/significant other Can pt return to current living arrangement?: Yes Admission Status: Involuntary Is patient capable of signing voluntary admission?: No Referral Source: Self/Family/Friend Insurance type: Medicare     Crisis Care Plan Living Arrangements: Spouse/significant other Name of Psychiatrist: None Name of Therapist: None  Education Status Is patient currently in school?: No Current Grade: NA Highest grade of school patient has completed: NA Name of school: NA  Contact person: NA  Risk to self with the past 6 months Suicidal Ideation: No Has patient been a risk to self within the past 6 months prior to admission? : No Suicidal Intent: No Has patient had any suicidal intent within the past 6 months prior to  admission? : No Is patient at risk for suicide?: No Suicidal Plan?: No Has patient had any suicidal plan within the past 6 months prior to admission? : No Access to Means: No What has been your use of drugs/alcohol within the last 12 months?: None Previous Attempts/Gestures: No (Unknown) How many times?: 0 Other Self Harm Risks: Pt is disorganized Triggers for Past Attempts: None known Intentional Self Injurious Behavior: None Family Suicide History: Unknown Recent stressful life event(s): Other (Comment) (UTI) Persecutory voices/beliefs?: Yes Depression: Yes Depression Symptoms: Feeling angry/irritable Substance abuse history and/or treatment for substance abuse?: No Suicide prevention information given to non-admitted patients: Not applicable  Risk to Others within the past 6 months Homicidal Ideation: No Does patient have any lifetime risk of violence toward others beyond the six months prior to admission? : No Thoughts of Harm to Others: No Current Homicidal Intent: No Current Homicidal Plan: No Access to Homicidal Means: No Identified Victim: None History of harm to others?: No Assessment of Violence: On admission Violent Behavior Description: Pt combative in ED Does patient have access to weapons?: No Criminal Charges Pending?: No Does patient have a court date: No Is patient on probation?: No  Psychosis Hallucinations: None noted Delusions: None noted  Mental Status Report Appearance/Hygiene: In hospital gown, Disheveled Eye Contact: Good Motor Activity: Unremarkable Speech: Tangential Level of Consciousness: Drowsy Mood: Pleasant Affect: Appropriate to circumstance Anxiety Level: None Thought Processes: Flight of Ideas, Tangential Judgement: Impaired Orientation: Not oriented Obsessive Compulsive Thoughts/Behaviors: None  Cognitive Functioning Concentration: Poor Memory: Recent Impaired, Remote Impaired IQ: Average Insight: Poor Impulse Control:  Fair Appetite: Fair Weight Loss: 0 Weight Gain: 0 Sleep: Unable to Assess Total Hours of Sleep: 0 (Unknown) Vegetative Symptoms: None  ADLScreening [facility] Assessment Services) Patient's cognitive ability adequate to safely complete daily activities?: No Patient able to express need for assistance with ADLs?: Yes Independently performs ADLs?: Yes (appropriate for developmental age)  Prior Inpatient Therapy Prior Inpatient Therapy: No (Unable to assess) Prior Therapy Dates: NA Prior Therapy Facilty/Provider(s): NA Reason for Treatment: NA  Prior Outpatient Therapy Prior Outpatient Therapy: No (Unable to assess) Prior Therapy Dates: NA Prior Therapy Facilty/Provider(s): NA Reason for Treatment: NA Does patient have an ACCT team?: No Does patient have Intensive In-House Services?  : No Does patient have [facility] services? : No Does patient have P4CC services?: No  ADL Screening (condition at time of admission) Patient's cognitive ability adequate to safely complete daily activities?: No Patient able to express need for assistance with ADLs?: Yes Independently performs ADLs?: Yes (appropriate for developmental age)             Merchant navy officer (For Healthcare) Does patient have an advance directive?: No (Unable to assess) Would patient like information on creating an advanced directive?: No - patient declined information    Additional Information 1:1 In Past 12 Months?: No CIRT Risk: Yes Elopement Risk: Yes Does patient have medical clearance?: Yes     Disposition: Gave clinical report to Donell Sievert, PA who recommends Pt be evaluated by psychiatry in the morning. Notified Eyvonne Mechanic, PA-C of recommendation.  Disposition Initial Assessment Completed for this Encounter: Yes Disposition of Patient: Other dispositions Other disposition(s): Other (Comment) (Pt to  be evaluated by psychiatry in the morning)   Pamalee Leyden, [facility], [facility], [facility] Triage  Specialist [phone]   Pamalee Leyden 02/25/2015 11:51 PM

## 2015-02-25 NOTE — ED Notes (Addendum)
Pt moved to TCU.  Pt is calm and cooperative and restraints have been removed.  Pt was changed into scrubs however security has not been called to wand pt as multiple staff members present at time of changing pt into scrubs, pt has no objects that she could harm herself with.  Will have security come to wand pt's belongings.

## 2015-02-25 NOTE — ED Notes (Signed)
[employer] called EMS due to fall, [employer] was on site due to husband calling wanting to IVC pt. Unsure why he wanted to IVC her. Pt has dementia. No injuries, pt is w/c bound.

## 2015-02-25 NOTE — ED Notes (Signed)
Bed: [id number] Expected date: 02/25/15 Expected time: 6:49 PM Means of arrival: Ambulance Comments: Fall, 82 yo

## 2015-02-25 NOTE — ED Notes (Signed)
Counselor at bedside.

## 2015-02-25 NOTE — BH Assessment (Signed)
Received notification of TTS consult request. Spoke to Eyvonne MechanicJeffrey Hedges, PA-C who said Pt has a history of dementia and today insisted on leaving the house, insisted she her husband was not who he said and was yelling. Pt has been placed under IVC. Assessment will be initiated.  Harlin RainFord Ellis Patsy BaltimoreWarrick Jr, LPC, [facility], [facility] Triage Specialist [phone]

## 2015-02-25 NOTE — ED Provider Notes (Signed)
CSN: [id number]     Arrival date & time 02/25/15  1857 History   First MD Initiated Contact with Patient 02/25/15 1901     Chief Complaint  Patient presents with  . Fall   HPI   Level V caveat due to dementia  81 [facility] EMS was called out by the husband. Information obtained via EMS, husband, patient. Husband reports that patient has a significant past medical history of dementia that has progressively worsened. He reports that today she was telling him she wanted to leave, that he was holding her hostage, that she was not his wife. She insisted leave the house, started screaming help, opened the front door and yelled for help from neighbors. Husband reports she did not fall that she laid down on the ground. He reports that he called the police department for assistance. They instructed him to place IVC papers on the patient, patient was transported via EMS here. At the time of my evaluation patient was resting comfortably in the exam bed is confused, did not know why she was here. Patient had no complaints.  Past Medical History  Diagnosis Date  . Dementia   . Hypertension   . GERD (gastroesophageal reflux disease)    Past Surgical History  Procedure Laterality Date  . Cholecystectomy N/A 01/03/2013    Procedure: LAPAROSCOPIC CHOLECYSTECTOMY WITH INTRAOPERATIVE CHOLANGIOGRAM;  Surgeon: Liz MaladyBurke E Thompson, MD;  [facility];  Service: General;  Laterality: N/A;   No family history on file. History  Substance Use Topics  . Smoking status: Never Smoker   . Smokeless tobacco: Not on file  . Alcohol Use: No   OB History    No data available     Review of Systems  Unable to perform ROS     Allergies  Review of patient's allergies indicates no known allergies.  Home Medications   Prior to Admission medications   Medication Sig Start Date End Date Taking? Authorizing Provider  acetaminophen (TYLENOL) 500 MG tablet Take 1,000 mg by mouth  every 6 (six) hours as needed for mild pain or headache.    Historical Provider, MD  amoxicillin-clavulanate (AUGMENTIN) 875-125 MG per tablet Take 1 tablet by mouth every 12 (twelve) hours. Patient not taking: Reported on 09/09/2014 01/05/13   Barnetta ChapelKelly Osborne, PA-C  atenolol-chlorthalidone (TENORETIC) 50-25 MG per tablet Take 1 tablet by mouth daily.    Historical Provider, MD  buPROPion (WELLBUTRIN SR) 150 MG 12 hr tablet Take 150 mg by mouth daily.    Historical Provider, MD  celecoxib (CELEBREX) 200 MG capsule Take 200 mg by mouth daily.    Historical Provider, MD  cyclobenzaprine (FLEXERIL) 10 MG tablet Take 10 mg by mouth 3 times/day as needed-between meals & bedtime for muscle spasms.     Historical Provider, MD  donepezil (ARICEPT) 10 MG tablet Take 10 mg by mouth daily.     Historical Provider, MD  estrogens, conjugated, (PREMARIN) 0.625 MG tablet Take 0.625 mg by mouth daily.    Historical Provider, MD  gabapentin (NEURONTIN) 300 MG capsule Take 300 mg by mouth at bedtime.     Historical Provider, MD  HYDROcodone-acetaminophen (NORCO/VICODIN) 5-325 MG per tablet Take 1 tablet by mouth every 6 (six) hours as needed for moderate pain. 10/26/14   Charlestine Nighthristopher Lawyer, PA-C  memantine (NAMENDA) 10 MG tablet Take 10 mg by mouth 2 (two) times daily.    Historical Provider, MD  omeprazole (PRILOSEC) 20 MG capsule Take 20 mg by  mouth daily.    Historical Provider, MD  oxyCODONE (OXY IR/ROXICODONE) 5 MG immediate release tablet Take 1-2 tablets (5-10 mg total) by mouth every 4 (four) hours as needed. Patient not taking: Reported on 09/09/2014 01/05/13   Barnetta Chapel, PA-C  oxyCODONE-acetaminophen (PERCOCET/ROXICET) 5-325 MG per tablet Take 1-2 tablets by mouth every 4 (four) hours as needed for moderate pain or severe pain. Patient not taking: Reported on 10/26/2014 09/09/14   Trixie Dredge, PA-C  QUEtiapine (SEROQUEL) 25 MG tablet Take 25 mg by mouth at bedtime.    Historical Provider, MD  Vilazodone HCl  (VIIBRYD) 40 MG TABS Take 40 mg by mouth daily.    Historical Provider, MD  zolpidem (AMBIEN) 10 MG tablet Take 10 mg by mouth at bedtime as needed for sleep.    Historical Provider, MD   BP 141/60 mmHg  Pulse 51  Temp(Src) 97.8 F (36.6 C) (Oral)  Resp 20  SpO2 100% Physical Exam  Constitutional: She is oriented to person, place, and time. She appears well-developed and well-nourished.  HENT:  Head: Normocephalic and atraumatic.  Eyes: Pupils are equal, round, and reactive to light.  Neck: Normal range of motion. Neck supple. No JVD present. No tracheal deviation present. No thyromegaly present.  Cardiovascular: Normal rate, regular rhythm, normal heart sounds and intact distal pulses.  Exam reveals no gallop and no friction rub.   No murmur heard. Pulmonary/Chest: Effort normal and breath sounds normal. No stridor. No respiratory distress. She has no wheezes. She has no rales. She exhibits no tenderness.  Abdominal: She exhibits no distension. There is no tenderness. There is no rebound and no guarding.  Musculoskeletal: Normal range of motion.  Lymphadenopathy:    She has no cervical adenopathy.  Neurological: She is alert and oriented to person, place, and time. Coordination normal.  Skin: Skin is warm and dry.  Psychiatric: She has a normal mood and affect. Her behavior is normal. Judgment and thought content normal.  Nursing note and vitals reviewed.   ED Course  Procedures (including critical care time) Labs Review Labs Reviewed - No data to display  Imaging Review No results found.   EKG Interpretation None      MDM   Final diagnoses:  Dementia, with behavioral disturbance  UTI (lower urinary tract infection)    Labs: Urine rapid drug screen, urinalysis, CBC, CMP, ethanol- significant for potassium 3.1, creatinine 1.07  Imaging: ED EKG  Consults: TTS  Therapeutics: Ativan  Assessment:   Plan: Patient presents with IVC papers due to erratic behavior,  and husbands fear of her leaving the house or causing harm to herself. At the time my evaluation patient was calm and collected, she was confused as to why she was here and had no complaints. Husband and son were here in the ED I spoke with them. They reported that it was unsafe for her to be at home, like further evaluation until she stabilized. Patient became angry at nursing staff swinging at them, and throwing shoes at me;  physical restraints were needed to keep her from harming staff and herself. Ativan 2 mg was used as well. Pt's vitals, physical exam, and labs were non contributory. This likely represents worsening dementia.   Pt was [facility] by [facility] who recommends face to face Psychiatry evaluation in the morning with social work consult.  Pt transferred to TCU.       Eyvonne Mechanic, PA-C 02/26/15 1738  Gwyneth Sprout, MD 02/27/15 0000

## 2015-02-26 DIAGNOSIS — F0390 Unspecified dementia without behavioral disturbance: Secondary | ICD-10-CM | POA: Insufficient documentation

## 2015-02-26 DIAGNOSIS — F0391 Unspecified dementia with behavioral disturbance: Secondary | ICD-10-CM

## 2015-02-26 LAB — TSH: TSH: 2.119 u[IU]/mL (ref 0.350–4.500)

## 2015-02-26 LAB — T4, FREE: Free T4: 0.91 ng/dL (ref 0.61–1.12)

## 2015-02-26 MED ORDER — HYDROXYZINE HCL 25 MG PO TABS
25.0000 mg | ORAL_TABLET | Freq: Once | ORAL | Status: AC
  Administered 2015-02-26: 25 mg via ORAL
  Filled 2015-02-26: qty 1

## 2015-02-26 MED ORDER — ATENOLOL-CHLORTHALIDONE 50-25 MG PO TABS: 0.5000 | ORAL_TABLET | Freq: Every day | ORAL | Status: DC

## 2015-02-26 MED ORDER — GABAPENTIN 300 MG PO CAPS
300.0000 mg | ORAL_CAPSULE | Freq: Every day | ORAL | Status: DC
  Administered 2015-02-26 – 2015-02-27 (×2): 300 mg via ORAL
  Filled 2015-02-26 (×2): qty 1

## 2015-02-26 MED ORDER — QUETIAPINE FUMARATE 25 MG PO TABS
12.5000 mg | ORAL_TABLET | Freq: Two times a day (BID) | ORAL | Status: DC | PRN
  Administered 2015-02-26 – 2015-02-28 (×5): 12.5 mg via ORAL
  Filled 2015-02-26 (×6): qty 1

## 2015-02-26 MED ORDER — QUETIAPINE FUMARATE 25 MG PO TABS
25.0000 mg | ORAL_TABLET | Freq: Three times a day (TID) | ORAL | Status: DC | PRN
  Administered 2015-02-26: 25 mg via ORAL
  Filled 2015-02-26: qty 1

## 2015-02-26 MED ORDER — LORAZEPAM 1 MG PO TABS
1.0000 mg | ORAL_TABLET | Freq: Three times a day (TID) | ORAL | Status: DC | PRN
  Administered 2015-02-26: 1 mg via ORAL
  Filled 2015-02-26: qty 1

## 2015-02-26 MED ORDER — VILAZODONE HCL 40 MG PO TABS
20.0000 mg | ORAL_TABLET | Freq: Every day | ORAL | Status: DC
  Administered 2015-02-27 – 2015-02-28 (×2): 20 mg via ORAL
  Filled 2015-02-26 (×3): qty 0.5

## 2015-02-26 MED ORDER — DONEPEZIL HCL 10 MG PO TABS
10.0000 mg | ORAL_TABLET | Freq: Every day | ORAL | Status: DC
  Administered 2015-02-26 – 2015-02-28 (×3): 10 mg via ORAL
  Filled 2015-02-26 (×3): qty 1

## 2015-02-26 MED ORDER — ACETAMINOPHEN 325 MG PO TABS: 650.0000 mg | ORAL_TABLET | ORAL | Status: DC | PRN

## 2015-02-26 MED ORDER — VILAZODONE HCL 40 MG PO TABS
40.0000 mg | ORAL_TABLET | Freq: Every day | ORAL | Status: DC
  Administered 2015-02-26: 40 mg via ORAL
  Filled 2015-02-26 (×2): qty 1

## 2015-02-26 MED ORDER — ATENOLOL 25 MG PO TABS
25.0000 mg | ORAL_TABLET | Freq: Every day | ORAL | Status: DC
  Administered 2015-02-26 – 2015-02-28 (×3): 25 mg via ORAL
  Filled 2015-02-26 (×3): qty 1

## 2015-02-26 MED ORDER — LORAZEPAM 0.5 MG PO TABS
0.5000 mg | ORAL_TABLET | Freq: Three times a day (TID) | ORAL | Status: DC | PRN
  Administered 2015-02-26 – 2015-02-28 (×4): 0.5 mg via ORAL
  Filled 2015-02-26 (×4): qty 1

## 2015-02-26 MED ORDER — CELECOXIB 200 MG PO CAPS
200.0000 mg | ORAL_CAPSULE | Freq: Every day | ORAL | Status: DC
  Administered 2015-02-26 – 2015-02-28 (×3): 200 mg via ORAL
  Filled 2015-02-26 (×3): qty 1

## 2015-02-26 MED ORDER — PANTOPRAZOLE SODIUM 40 MG PO TBEC
40.0000 mg | DELAYED_RELEASE_TABLET | Freq: Every day | ORAL | Status: DC
  Administered 2015-02-26 – 2015-02-28 (×3): 40 mg via ORAL
  Filled 2015-02-26 (×3): qty 1

## 2015-02-26 MED ORDER — BUPROPION HCL ER (SR) 150 MG PO TB12
150.0000 mg | ORAL_TABLET | Freq: Every day | ORAL | Status: DC
  Administered 2015-02-26: 150 mg via ORAL
  Filled 2015-02-26: qty 1

## 2015-02-26 MED ORDER — ESTROGENS CONJUGATED 0.625 MG PO TABS
0.6250 mg | ORAL_TABLET | Freq: Every day | ORAL | Status: DC
  Administered 2015-02-26 – 2015-02-28 (×3): 0.625 mg via ORAL
  Filled 2015-02-26 (×3): qty 1

## 2015-02-26 MED ORDER — POTASSIUM CHLORIDE CRYS ER 20 MEQ PO TBCR
40.0000 meq | EXTENDED_RELEASE_TABLET | Freq: Two times a day (BID) | ORAL | Status: AC
  Administered 2015-02-26 (×2): 40 meq via ORAL
  Filled 2015-02-26 (×2): qty 2

## 2015-02-26 MED ORDER — BUPROPION HCL ER (SR) 100 MG PO TB12
100.0000 mg | ORAL_TABLET | Freq: Every day | ORAL | Status: DC
  Administered 2015-02-27 – 2015-02-28 (×2): 100 mg via ORAL
  Filled 2015-02-26 (×2): qty 1

## 2015-02-26 MED ORDER — LEVOTHYROXINE SODIUM 25 MCG PO TABS
25.0000 ug | ORAL_TABLET | Freq: Every day | ORAL | Status: DC
  Administered 2015-02-26 – 2015-02-28 (×3): 25 ug via ORAL
  Filled 2015-02-26 (×4): qty 1

## 2015-02-26 MED ORDER — CHLORTHALIDONE 25 MG PO TABS
12.5000 mg | ORAL_TABLET | Freq: Every day | ORAL | Status: DC
  Administered 2015-02-26 – 2015-02-28 (×3): 12.5 mg via ORAL
  Filled 2015-02-26 (×3): qty 0.5

## 2015-02-26 MED ORDER — ONDANSETRON HCL 4 MG PO TABS: 4.0000 mg | ORAL_TABLET | Freq: Three times a day (TID) | ORAL | Status: DC | PRN

## 2015-02-26 MED ORDER — MEMANTINE HCL 10 MG PO TABS
10.0000 mg | ORAL_TABLET | Freq: Two times a day (BID) | ORAL | Status: DC
  Administered 2015-02-26 – 2015-02-28 (×5): 10 mg via ORAL
  Filled 2015-02-26 (×6): qty 1

## 2015-02-26 NOTE — Progress Notes (Signed)
CSW reached out to Dr.Cobos who states that the recommendation for patient at this time is for the [facility]  Also, CSW spoke with NP who states that the pt has a UTI and is currently being treated at this time.  The pt does not currently meet insurance criteria for ALF.  CSW reached out to patient's husband and updated him on geri-psych recommendation. Husband expressed understanding.  Fair PlainHusband/ Pamela Cummings [phone]  Trish MageBrittney Lariza Cothron, LCSWA [phone] ED CSW 02/26/2015 4:24 PM

## 2015-02-26 NOTE — Progress Notes (Signed)
CSW referred the pt to the following facilities in order to try and obtain bed placement :  [facility]  Trish MageBrittney Jarelis Ehlert, LCSWA [phone] ED CSW 02/26/2015 11:07 PM

## 2015-02-26 NOTE — ED Notes (Signed)
Pt has been hitting at sitter, threatening to leave, cursing out the sitter. When RN attempted to give pt her medications, pt tried to slap medications out of RNs hand and she took her water and slung across the room and crushed the cup on the floor under her feet. MD notified.

## 2015-02-26 NOTE — Progress Notes (Signed)
TCU RN inquired about son being called by SW  CM spoke with [place] [facility] who able to touch basis with son using contact information on face sheet

## 2015-02-26 NOTE — ED Notes (Signed)
Patient woke up, reporting she needed to use the restroom. Pt was assisted with steady to the restroom and back to bed. Pt is calm, cooperative. Pt just urinated. No signs of distress.

## 2015-02-26 NOTE — Consult Note (Addendum)
Pamela Cummings Psychiatry Consult   Reason for Consult: confusion, combativeness .  Referring Physician:  ED Phsycian Patient Identification: Pamela Cummings MRN:  [id number] Principal Diagnosis: Dementia  \Diagnosis:  There are no active problems to display for this patient.   Total Time spent with patient: 30 minutes  Subjective:   Pamela Cummings is a 81 y.o. female patient admitted with history of dementia.  HPI:    Patient is an 81 year old married female. She lives with her husband. She has a history of dementia. Most information obtained from chart and from husband, as patient unable to provide any information. Patient has a history of dementia, which has been worsening gradually. Husband is 24 years old and has been her primary care giver. He states she has become increasingly confused, accusing him of not being her husband and kidnapping her, telling him there are other men courting her. States " I can't handle her anymore ". She opened the door, tried to run away, and was yelling for help on the street. He is concerned because she has been falling often , as well. In ED she was initially angry, combative, and had to be restrained . She was striking out at staff and threw her shoes at staff. At this time she is calm, in bed, alert , attentive, but  Quite confused /disoriented - initially thought she was at home and asked writer " what are you doing in my house ?" , was unable to provide date/ month, and stated year as 2020. She states " I do not know why am here ", " I do not know how I got here". Her recall is 3/3 immediate but 0/3 at 3 minutes. She denies any recent  Symptoms and states " I am fine ". She  Denies hallucinations, denies feeling depressed, denies any SI or HI.  HPI Elements:  Chronic Dementia, gradually worsening, resulting in paranoid ideations such as thinking her husband is not whom he says he is and that she is being kidnapped, engaging in dangerous behaviors  , such as running out into the street yelling for help.   Past Medical History:  Past Medical History  Diagnosis Date  . Dementia   . Hypertension   . GERD (gastroesophageal reflux disease)     Past Surgical History  Procedure Laterality Date  . Cholecystectomy N/A 01/03/2013    Procedure: LAPAROSCOPIC CHOLECYSTECTOMY WITH INTRAOPERATIVE CHOLANGIOGRAM;  Surgeon: Zenovia Jarred, MD;  Location: [facility];  Service: General;  Laterality: N/A;   Family History: No family history on file. Social History:  History  Alcohol Use No     History  Drug Use Not on file    History   Social History  . Marital Status: Married    Spouse Name: N/A  . Number of Children: N/A  . Years of Education: N/A   Social History Main Topics  . Smoking status: Never Smoker   . Smokeless tobacco: Not on file  . Alcohol Use: No  . Drug Use: Not on file  . Sexual Activity: Not on file   Other Topics Concern  . None   Social History Narrative   Additional Social History:    Pain Medications: None Prescriptions: See MAR Over the Counter: None History of alcohol / drug use?: No history of alcohol / drug abuse Longest period of sobriety (when/how long): NA  Allergies:  No Known Allergies  Labs:  Results for orders placed or performed during the hospital encounter of 02/25/15 (from the past 48 hour(s))  CBC WITH DIFFERENTIAL     Status: None   Collection Time: 02/25/15  8:32 PM  Result Value Ref Range   WBC 6.5 4.0 - 10.5 K/uL   RBC 4.24 3.87 - 5.11 MIL/uL   Hemoglobin 13.0 12.0 - 15.0 g/dL   HCT 39.8 36.0 - 46.0 %   MCV 93.9 78.0 - 100.0 fL   MCH 30.7 26.0 - 34.0 pg   MCHC 32.7 30.0 - 36.0 g/dL   RDW 13.6 11.5 - 15.5 %   Platelets 232 150 - 400 K/uL   Neutrophils Relative % 48 43 - 77 %   Lymphocytes Relative 43 12 - 46 %   Monocytes Relative 7 3 - 12 %   Eosinophils Relative 2 0 - 5 %   Basophils Relative 0 0 - 1 %   Neutro Abs 3.1 1.7 - 7.7 K/uL   Lymphs  Abs 2.8 0.7 - 4.0 K/uL   Monocytes Absolute 0.5 0.1 - 1.0 K/uL   Eosinophils Absolute 0.1 0.0 - 0.7 K/uL   Basophils Absolute 0.0 0.0 - 0.1 K/uL   RBC Morphology STOMATOCYTES   Comprehensive metabolic panel     Status: Abnormal   Collection Time: 02/25/15  8:32 PM  Result Value Ref Range   Sodium 140 135 - 145 mmol/L   Potassium 3.1 (L) 3.5 - 5.1 mmol/L   Chloride 99 (L) 101 - 111 mmol/L   CO2 30 22 - 32 mmol/L   Glucose, Bld 111 (H) 65 - 99 mg/dL   BUN 18 6 - 20 mg/dL   Creatinine, Ser 1.07 (H) 0.44 - 1.00 mg/dL   Calcium 9.3 8.9 - 10.3 mg/dL   Total Protein 7.4 6.5 - 8.1 g/dL   Albumin 3.8 3.5 - 5.0 g/dL   AST 19 15 - 41 U/L   ALT 12 (L) 14 - 54 U/L   Alkaline Phosphatase 84 38 - 126 U/L   Total Bilirubin 0.3 0.3 - 1.2 mg/dL   GFR calc non Af Amer 48 (L) >60 mL/min   GFR calc Af Amer 55 (L) >60 mL/min    Comment: (NOTE) The eGFR has been calculated using the CKD EPI equation. This calculation has not been validated in all clinical situations. eGFR's persistently <60 mL/min signify possible Chronic Kidney Disease.    Anion gap 11 5 - 15  Ethanol     Status: None   Collection Time: 02/25/15  8:32 PM  Result Value Ref Range   Alcohol, Ethyl (B) <5 <5 mg/dL    Comment:        LOWEST DETECTABLE LIMIT FOR SERUM ALCOHOL IS 5 mg/dL FOR MEDICAL PURPOSES ONLY   Urine rapid drug screen (hosp performed)not at [facility]     Status: None   Collection Time: 02/25/15  9:54 PM  Result Value Ref Range   Opiates NONE DETECTED NONE DETECTED   Cocaine NONE DETECTED NONE DETECTED   Benzodiazepines NONE DETECTED NONE DETECTED   Amphetamines NONE DETECTED NONE DETECTED   Tetrahydrocannabinol NONE DETECTED NONE DETECTED   Barbiturates NONE DETECTED NONE DETECTED    Comment:        DRUG SCREEN FOR MEDICAL PURPOSES ONLY.  IF CONFIRMATION IS NEEDED FOR ANY PURPOSE, NOTIFY LAB WITHIN 5 DAYS.        LOWEST DETECTABLE LIMITS FOR URINE DRUG SCREEN Drug Class  Cutoff (ng/mL) Amphetamine       1000 Barbiturate      200 Benzodiazepine   606 Tricyclics       301 Opiates          300 Cocaine          300 THC              50   Urinalysis, Routine w reflex microscopic (not at [facility])     Status: Abnormal   Collection Time: 02/25/15  9:54 PM  Result Value Ref Range   Color, Urine YELLOW YELLOW   APPearance CLEAR CLEAR   Specific Gravity, Urine 1.018 1.005 - 1.030   pH 5.5 5.0 - 8.0   Glucose, UA NEGATIVE NEGATIVE mg/dL   Hgb urine dipstick NEGATIVE NEGATIVE   Bilirubin Urine NEGATIVE NEGATIVE   Ketones, ur NEGATIVE NEGATIVE mg/dL   Protein, ur NEGATIVE NEGATIVE mg/dL   Urobilinogen, UA 0.2 0.0 - 1.0 mg/dL   Nitrite POSITIVE (A) NEGATIVE   Leukocytes, UA SMALL (A) NEGATIVE  Urine microscopic-add on     Status: Abnormal   Collection Time: 02/25/15  9:54 PM  Result Value Ref Range   Squamous Epithelial / LPF RARE RARE   WBC, UA 11-20 <3 WBC/hpf   Bacteria, UA MANY (A) RARE    Vitals: Blood pressure 127/58, pulse 45, temperature 97.6 F (36.4 C), temperature source Oral, resp. rate 14, SpO2 95 %.  Risk to Self: Suicidal Ideation: No Suicidal Intent: No Is patient at risk for suicide?: No Suicidal Plan?: No Access to Means: No What has been your use of drugs/alcohol within the last 12 months?: None How many times?: 0 Other Self Harm Risks: Pt is disorganized Triggers for Past Attempts: None known Intentional Self Injurious Behavior: None Risk to Others: Homicidal Ideation: No Thoughts of Harm to Others: No Current Homicidal Intent: No Current Homicidal Plan: No Access to Homicidal Means: No Identified Victim: None History of harm to others?: No Assessment of Violence: On admission Violent Behavior Description: Pt combative in ED Does patient have access to weapons?: No Criminal Charges Pending?: No Does patient have a court date: No Prior Inpatient Therapy: Prior Inpatient Therapy: No (Unable to assess) Prior Therapy Dates: NA Prior Therapy  Facilty/Provider(s): NA Reason for Treatment: NA Prior Outpatient Therapy: Prior Outpatient Therapy: No (Unable to assess) Prior Therapy Dates: NA Prior Therapy Facilty/Provider(s): NA Reason for Treatment: NA Does patient have an ACCT team?: No Does patient have Intensive In-House Services?  : No Does patient have [facility] services? : No Does patient have P4CC services?: No  Current Facility-Administered Medications  Medication Dose Route Frequency Provider Last Rate Last Dose  . acetaminophen (TYLENOL) tablet 650 mg  650 mg Oral Q4H PRN Virgel Manifold, MD      . atenolol (TENORMIN) tablet 25 mg  25 mg Oral Daily Virgel Manifold, MD   25 mg at 02/26/15 6010   And  . chlorthalidone (HYGROTON) tablet 12.5 mg  12.5 mg Oral Daily Virgel Manifold, MD   12.5 mg at 02/26/15 0915  . buPROPion [facility] SR) 12 hr tablet 150 mg  150 mg Oral Daily Virgel Manifold, MD   150 mg at 02/26/15 0916  . celecoxib (CELEBREX) capsule 200 mg  200 mg Oral Daily Virgel Manifold, MD   200 mg at 02/26/15 0916  . cephALEXin (KEFLEX) capsule 500 mg  500 mg Oral Q12H Jeffrey Hedges, PA-C   500 mg at 02/26/15 0915  . donepezil (ARICEPT) tablet 10 mg  10 mg Oral Daily Virgel Manifold, MD   10 mg at 02/26/15 0916  . estrogens (conjugated) (PREMARIN) tablet 0.625 mg  0.625 mg Oral Daily Virgel Manifold, MD   0.625 mg at 02/26/15 0915  . gabapentin (NEURONTIN) capsule 300 mg  300 mg Oral QHS Virgel Manifold, MD      . levothyroxine (SYNTHROID, LEVOTHROID) tablet 25 mcg  25 mcg Oral QAC breakfast Virgel Manifold, MD   25 mcg at 02/26/15 [id number]  . LORazepam (ATIVAN) tablet 1 mg  1 mg Oral Q8H PRN Virgel Manifold, MD   1 mg at 02/26/15 0915  . memantine (NAMENDA) tablet 10 mg  10 mg Oral BID Virgel Manifold, MD   10 mg at 02/26/15 0916  . ondansetron (ZOFRAN) tablet 4 mg  4 mg Oral Q8H PRN Virgel Manifold, MD      . pantoprazole (PROTONIX) EC tablet 40 mg  40 mg Oral Daily Virgel Manifold, MD   40 mg at 02/26/15 0916  . QUEtiapine (SEROQUEL) tablet  25 mg  25 mg Oral TID PRN Virgel Manifold, MD   25 mg at 02/26/15 0915  . Vilazodone HCl (VIIBRYD) TABS 40 mg  40 mg Oral Q breakfast Virgel Manifold, MD   40 mg at 02/26/15 0321   Current Outpatient Prescriptions  Medication Sig Dispense Refill  . acetaminophen (TYLENOL) 500 MG tablet Take 1,000 mg by mouth every 6 (six) hours as needed for mild pain or headache.    Marland Kitchen atenolol-chlorthalidone (TENORETIC) 50-25 MG per tablet Take 0.5 tablets by mouth daily.     Marland Kitchen buPROPion (WELLBUTRIN SR) 150 MG 12 hr tablet Take 150 mg by mouth daily.    . celecoxib (CELEBREX) 200 MG capsule Take 200 mg by mouth daily.    Marland Kitchen donepezil (ARICEPT) 10 MG tablet Take 10 mg by mouth daily.     Marland Kitchen estrogens, conjugated, (PREMARIN) 0.625 MG tablet Take 0.625 mg by mouth daily.    Marland Kitchen gabapentin (NEURONTIN) 300 MG capsule Take 300 mg by mouth at bedtime.     Marland Kitchen levothyroxine (SYNTHROID, LEVOTHROID) 25 MCG tablet Take 25 mcg by mouth daily before breakfast.    . memantine (NAMENDA) 10 MG tablet Take 10 mg by mouth 2 (two) times daily.    Marland Kitchen omeprazole (PRILOSEC) 20 MG capsule Take 20 mg by mouth daily.    . QUEtiapine (SEROQUEL) 25 MG tablet Take 25 mg by mouth 3 (three) times daily as needed (agitation).     . Vilazodone HCl (VIIBRYD) 40 MG TABS Take 40 mg by mouth daily.    Marland Kitchen zolpidem (AMBIEN) 10 MG tablet Take 10 mg by mouth at bedtime as needed for sleep.    Marland Kitchen HYDROcodone-acetaminophen (NORCO/VICODIN) 5-325 MG per tablet Take 1 tablet by mouth every 6 (six) hours as needed for moderate pain. (Patient not taking: Reported on 02/25/2015) 15 tablet 0  . oxyCODONE (OXY IR/ROXICODONE) 5 MG immediate release tablet Take 1-2 tablets (5-10 mg total) by mouth every 4 (four) hours as needed. (Patient not taking: Reported on 09/09/2014) 30 tablet 0  . oxyCODONE-acetaminophen (PERCOCET/ROXICET) 5-325 MG per tablet Take 1-2 tablets by mouth every 4 (four) hours as needed for moderate pain or severe pain. (Patient not taking: Reported on  10/26/2014) 20 tablet 0    Musculoskeletal: Strength & Muscle Tone: within normal limits Gait & Station: gait not examined  Patient leans: N/A  Psychiatric Specialty Exam: Physical Exam  ROS denies headache, denies chest pain, denies SOB, does not endorse dysuria.  Blood  pressure 127/58, pulse 45, temperature 97.6 F (36.4 C), temperature source Oral, resp. rate 14, SpO2 95 %.There is no weight on file to calculate BMI.  General Appearance: fairly groomed   Engineer, water::  Good  Speech:  Normal Rate  Volume:  Decreased  Mood:  denies depression- states " I feel fine ", not angry or irritable at this time  Affect:  at this time somewhat blunted , but not irritable , angry  Thought Process:  slow   Orientation:  Other:  disoriented to place and time  Thought Content:  denies hallucinations, not internally preoccupied   Suicidal Thoughts:  No  Homicidal Thoughts:  No  Memory:  recent and remote poor   Judgement:  Impaired  Insight:  Lacking  Psychomotor Activity:  Decreased- no agitation at this time  Concentration:  Fair  Recall:  Poor  Fund of Knowledge:Fair  Language: Fair  Akathisia:  No  Handed:  Right  AIMS (if indicated):     Assets:  Desire for Improvement Social Support  ADL's:  Impaired  Cognition: Impaired,  Severe  Sleep:      Medical Decision Making: Review of Psycho-Social Stressors (1), Review or order clinical lab tests (1), Established Problem, Worsening (2) and Review of Medication Regimen & Side Effects (2)  Treatment Plan Summary: Daily contact with patient to assess and evaluate symptoms and progress in treatment  Plan:  I have discussed case with staff and with husband- husband feels he can not manage her at home any longer, and him and family are wanting to consider a Nursing Home   There are criteria for involuntary commitment due to risk to self  CSW /team will continue to evaluate regarding going to Nursing Home versus possible Gero-psychiatric  admission, depending on presentation/ symptoms  Will decrease Ativan and Seroquel PRN doses . Based on age and concern about side effects, will taper down Wellbutrin SR and Viibryd doses  .  Will  Correct hypokalemia.  On Keflex for UTI  Kaylor Simenson, [facility] 02/26/2015 12:38 PM

## 2015-02-27 ENCOUNTER — Emergency Department (HOSPITAL_COMMUNITY): Payer: Medicare Other

## 2015-02-27 DIAGNOSIS — F0391 Unspecified dementia with behavioral disturbance: Secondary | ICD-10-CM | POA: Diagnosis not present

## 2015-02-27 DIAGNOSIS — F0390 Unspecified dementia without behavioral disturbance: Secondary | ICD-10-CM | POA: Diagnosis not present

## 2015-02-27 DIAGNOSIS — F03918 Unspecified dementia, unspecified severity, with other behavioral disturbance: Secondary | ICD-10-CM | POA: Diagnosis present

## 2015-02-27 NOTE — ED Notes (Signed)
Daughter at bedside.

## 2015-02-27 NOTE — ED Notes (Signed)
Pt sleeping with sitter at bedside 

## 2015-02-27 NOTE — ED Notes (Signed)
Pt transported to Xray. 

## 2015-02-27 NOTE — ED Notes (Addendum)
Daughter and Husband would like update when placement fount:  Daughter Phone #[phone]  Husband Phone #[phone]

## 2015-02-27 NOTE — ED Notes (Signed)
Pt trying to get out of bed, pt redirected back to bed

## 2015-02-27 NOTE — ED Notes (Signed)
Psychiatry at bedside with pt and family.

## 2015-02-27 NOTE — Progress Notes (Signed)
Clinical research associateWriter received [place]. Delorise ShinerGrace requesting IVC and EKG papers for potential admission. Nurse [facility].  P[place]ia Zanyia Silbaugh, [place] Disposition staff 02/27/2015 5:48 PM

## 2015-02-27 NOTE — Progress Notes (Signed)
CS[facility].  Trish MageBrittney Franki Alcaide, LCSWA [phone] ED CSW 02/27/2015 8:46 PM

## 2015-02-27 NOTE — Consult Note (Signed)
[facility] Psychiatry Consult   Reason for Consult:  Agitation, aggression at times Referring Physician:  EDP Patient Identification: Pamela Cummings MRN:  [id number] Principal Diagnosis: Dementia with behavioral disturbance Diagnosis:   Patient Active Problem List   Diagnosis Date Noted  . Dementia with behavioral disturbance [F03.91] 02/27/2015    Priority: High  . Dementia [F03.90]     Total Time spent with patient: 30 minutes  Subjective:   Pamela Cummings is a 81 y.o. female patient admitted with agitation, behavior disturbances.  HPI:  The patient has been taken care of by her 62 yo husband but has had a decline in her dementia with some agitation, aggression.  Prior to admission, she told her husband that she wanted to leave the house, that she was not his wife and that he was holding her hostage.  She insisted on leaving the house and was screaming for help and trying to contact neighbors.  She then laid on the ground while her husband called the police.  Per ED notes, patient became combative and verbally aggressive and had to be restrained.  On assessment, she cannot remember why she is her or why she got upset with staff last night when her daughter asked her.  She is pleasant, engaging, and cooperative on assessment.  Pamela Cummings is agreeable to go to geropsychiatry and the plan after an evaluation is to transfer to a memory care center, this piece she is unaware. HPI Elements:   Location:  generalized. Quality:  acute. Severity:  severe. Timing:  constant. Duration:  few days. Context:  stressors.  Past Medical History:  Past Medical History  Diagnosis Date  . Dementia   . Hypertension   . GERD (gastroesophageal reflux disease)     Past Surgical History  Procedure Laterality Date  . Cholecystectomy N/A 01/03/2013    Procedure: LAPAROSCOPIC CHOLECYSTECTOMY WITH INTRAOPERATIVE CHOLANGIOGRAM;  Surgeon: Zenovia Jarred, MD;  Location: [facility];  Service: General;   Laterality: N/A;   Family History: No family history on file. Social History:  History  Alcohol Use No     History  Drug Use Not on file    History   Social History  . Marital Status: Married    Spouse Name: N/A  . Number of Children: N/A  . Years of Education: N/A   Social History Main Topics  . Smoking status: Never Smoker   . Smokeless tobacco: Not on file  . Alcohol Use: No  . Drug Use: Not on file  . Sexual Activity: Not on file   Other Topics Concern  . None   Social History Narrative   Additional Social History:    Pain Medications: None Prescriptions: See MAR Over the Counter: None History of alcohol / drug use?: No history of alcohol / drug abuse Longest period of sobriety (when/how long): NA                     Allergies:  No Known Allergies  Labs:  Results for orders placed or performed during the hospital encounter of 02/25/15 (from the past 48 hour(s))  CBC WITH DIFFERENTIAL     Status: None   Collection Time: 02/25/15  8:32 PM  Result Value Ref Range   WBC 6.5 4.0 - 10.5 K/uL   RBC 4.24 3.87 - 5.11 MIL/uL   Hemoglobin 13.0 12.0 - 15.0 g/dL   HCT 39.8 36.0 - 46.0 %   MCV 93.9 78.0 - 100.0 fL  MCH 30.7 26.0 - 34.0 pg   MCHC 32.7 30.0 - 36.0 g/dL   RDW 13.6 11.5 - 15.5 %   Platelets 232 150 - 400 K/uL   Neutrophils Relative % 48 43 - 77 %   Lymphocytes Relative 43 12 - 46 %   Monocytes Relative 7 3 - 12 %   Eosinophils Relative 2 0 - 5 %   Basophils Relative 0 0 - 1 %   Neutro Abs 3.1 1.7 - 7.7 K/uL   Lymphs Abs 2.8 0.7 - 4.0 K/uL   Monocytes Absolute 0.5 0.1 - 1.0 K/uL   Eosinophils Absolute 0.1 0.0 - 0.7 K/uL   Basophils Absolute 0.0 0.0 - 0.1 K/uL   RBC Morphology STOMATOCYTES   Comprehensive metabolic panel     Status: Abnormal   Collection Time: 02/25/15  8:32 PM  Result Value Ref Range   Sodium 140 135 - 145 mmol/L   Potassium 3.1 (L) 3.5 - 5.1 mmol/L   Chloride 99 (L) 101 - 111 mmol/L   CO2 30 22 - 32 mmol/L    Glucose, Bld 111 (H) 65 - 99 mg/dL   BUN 18 6 - 20 mg/dL   Creatinine, Ser 1.07 (H) 0.44 - 1.00 mg/dL   Calcium 9.3 8.9 - 10.3 mg/dL   Total Protein 7.4 6.5 - 8.1 g/dL   Albumin 3.8 3.5 - 5.0 g/dL   AST 19 15 - 41 U/L   ALT 12 (L) 14 - 54 U/L   Alkaline Phosphatase 84 38 - 126 U/L   Total Bilirubin 0.3 0.3 - 1.2 mg/dL   GFR calc non Af Amer 48 (L) >60 mL/min   GFR calc Af Amer 55 (L) >60 mL/min    Comment: (NOTE) The eGFR has been calculated using the CKD EPI equation. This calculation has not been validated in all clinical situations. eGFR's persistently <60 mL/min signify possible Chronic Kidney Disease.    Anion gap 11 5 - 15  Ethanol     Status: None   Collection Time: 02/25/15  8:32 PM  Result Value Ref Range   Alcohol, Ethyl (B) <5 <5 mg/dL    Comment:        LOWEST DETECTABLE LIMIT FOR SERUM ALCOHOL IS 5 mg/dL FOR MEDICAL PURPOSES ONLY   Urine rapid drug screen (hosp performed)not at [facility]     Status: None   Collection Time: 02/25/15  9:54 PM  Result Value Ref Range   Opiates NONE DETECTED NONE DETECTED   Cocaine NONE DETECTED NONE DETECTED   Benzodiazepines NONE DETECTED NONE DETECTED   Amphetamines NONE DETECTED NONE DETECTED   Tetrahydrocannabinol NONE DETECTED NONE DETECTED   Barbiturates NONE DETECTED NONE DETECTED    Comment:        DRUG SCREEN FOR MEDICAL PURPOSES ONLY.  IF CONFIRMATION IS NEEDED FOR ANY PURPOSE, NOTIFY LAB WITHIN 5 DAYS.        LOWEST DETECTABLE LIMITS FOR URINE DRUG SCREEN Drug Class       Cutoff (ng/mL) Amphetamine      1000 Barbiturate      200 Benzodiazepine   710 Tricyclics       626 Opiates          300 Cocaine          300 THC              50   Urinalysis, Routine w reflex microscopic (not at [facility])     Status: Abnormal   Collection Time: 02/25/15  9:54 PM  Result Value Ref Range   Color, Urine YELLOW YELLOW   APPearance CLEAR CLEAR   Specific Gravity, Urine 1.018 1.005 - 1.030   pH 5.5 5.0 - 8.0   Glucose, UA NEGATIVE  NEGATIVE mg/dL   Hgb urine dipstick NEGATIVE NEGATIVE   Bilirubin Urine NEGATIVE NEGATIVE   Ketones, ur NEGATIVE NEGATIVE mg/dL   Protein, ur NEGATIVE NEGATIVE mg/dL   Urobilinogen, UA 0.2 0.0 - 1.0 mg/dL   Nitrite POSITIVE (A) NEGATIVE   Leukocytes, UA SMALL (A) NEGATIVE  Urine microscopic-add on     Status: Abnormal   Collection Time: 02/25/15  9:54 PM  Result Value Ref Range   Squamous Epithelial / LPF RARE RARE   WBC, UA 11-20 <3 WBC/hpf   Bacteria, UA MANY (A) RARE  TSH     Status: None   Collection Time: 02/26/15  1:12 PM  Result Value Ref Range   TSH 2.119 0.350 - 4.500 uIU/mL  T4, free     Status: None   Collection Time: 02/26/15  1:13 PM  Result Value Ref Range   Free T4 0.91 0.61 - 1.12 ng/dL    Comment: Performed at [facility]: Blood pressure 142/68, pulse 52, temperature 98.7 F (37.1 C), temperature source Oral, resp. rate 18, SpO2 98 %.  Risk to Self: Suicidal Ideation: No Suicidal Intent: No Is patient at risk for suicide?: No Suicidal Plan?: No Access to Means: No What has been your use of drugs/alcohol within the last 12 months?: None How many times?: 0 Other Self Harm Risks: Pt is disorganized Triggers for Past Attempts: None known Intentional Self Injurious Behavior: None Risk to Others: Homicidal Ideation: No Thoughts of Harm to Others: No Current Homicidal Intent: No Current Homicidal Plan: No Access to Homicidal Means: No Identified Victim: None History of harm to others?: No Assessment of Violence: On admission Violent Behavior Description: Pt combative in ED Does patient have access to weapons?: No Criminal Charges Pending?: No Does patient have a court date: No Prior Inpatient Therapy: Prior Inpatient Therapy: No (Unable to assess) Prior Therapy Dates: NA Prior Therapy Facilty/Provider(s): NA Reason for Treatment: NA Prior Outpatient Therapy: Prior Outpatient Therapy: No (Unable to assess) Prior Therapy Dates:  NA Prior Therapy Facilty/Provider(s): NA Reason for Treatment: NA Does patient have an ACCT team?: No Does patient have Intensive In-House Services?  : No Does patient have [facility] services? : No Does patient have P4CC services?: No  Current Facility-Administered Medications  Medication Dose Route Frequency Provider Last Rate Last Dose  . acetaminophen (TYLENOL) tablet 650 mg  650 mg Oral Q4H PRN Virgel Manifold, MD      . atenolol (TENORMIN) tablet 25 mg  25 mg Oral Daily Virgel Manifold, MD   25 mg at 02/27/15 1038   And  . chlorthalidone (HYGROTON) tablet 12.5 mg  12.5 mg Oral Daily Virgel Manifold, MD   12.5 mg at 02/27/15 1038  . buPROPion (WELLBUTRIN SR) 12 hr tablet 100 mg  100 mg Oral Daily Myer Peer Cobos, MD   100 mg at 02/27/15 1038  . celecoxib (CELEBREX) capsule 200 mg  200 mg Oral Daily Virgel Manifold, MD   200 mg at 02/27/15 1038  . cephALEXin (KEFLEX) capsule 500 mg  500 mg Oral Q12H Jeffrey Hedges, PA-C   500 mg at 02/27/15 1038  . donepezil (ARICEPT) tablet 10 mg  10 mg Oral Daily Virgel Manifold, MD   10 mg at 02/27/15 1038  .  estrogens (conjugated) (PREMARIN) tablet 0.625 mg  0.625 mg Oral Daily Virgel Manifold, MD   0.625 mg at 02/27/15 1038  . gabapentin (NEURONTIN) capsule 300 mg  300 mg Oral QHS Virgel Manifold, MD   300 mg at 02/26/15 2056  . levothyroxine (SYNTHROID, LEVOTHROID) tablet 25 mcg  25 mcg Oral QAC breakfast Virgel Manifold, MD   25 mcg at 02/27/15 0747  . LORazepam (ATIVAN) tablet 0.5 mg  0.5 mg Oral Q8H PRN Jenne Campus, MD   0.5 mg at 02/27/15 1121  . memantine (NAMENDA) tablet 10 mg  10 mg Oral BID Virgel Manifold, MD   10 mg at 02/27/15 1038  . ondansetron (ZOFRAN) tablet 4 mg  4 mg Oral Q8H PRN Virgel Manifold, MD      . pantoprazole (PROTONIX) EC tablet 40 mg  40 mg Oral Daily Virgel Manifold, MD   40 mg at 02/27/15 1038  . QUEtiapine (SEROQUEL) tablet 12.5 mg  12.5 mg Oral BID PRN Jenne Campus, MD   12.5 mg at 02/27/15 1343  . Vilazodone HCl (VIIBRYD) TABS  20 mg  20 mg Oral Q breakfast Jenne Campus, MD   20 mg at 02/27/15 9147   Current Outpatient Prescriptions  Medication Sig Dispense Refill  . acetaminophen (TYLENOL) 500 MG tablet Take 1,000 mg by mouth every 6 (six) hours as needed for mild pain or headache.    Marland Kitchen atenolol-chlorthalidone (TENORETIC) 50-25 MG per tablet Take 0.5 tablets by mouth daily.     Marland Kitchen buPROPion (WELLBUTRIN SR) 150 MG 12 hr tablet Take 150 mg by mouth daily.    . celecoxib (CELEBREX) 200 MG capsule Take 200 mg by mouth daily.    Marland Kitchen donepezil (ARICEPT) 10 MG tablet Take 10 mg by mouth daily.     Marland Kitchen estrogens, conjugated, (PREMARIN) 0.625 MG tablet Take 0.625 mg by mouth daily.    Marland Kitchen gabapentin (NEURONTIN) 300 MG capsule Take 300 mg by mouth at bedtime.     Marland Kitchen levothyroxine (SYNTHROID, LEVOTHROID) 25 MCG tablet Take 25 mcg by mouth daily before breakfast.    . memantine (NAMENDA) 10 MG tablet Take 10 mg by mouth 2 (two) times daily.    Marland Kitchen omeprazole (PRILOSEC) 20 MG capsule Take 20 mg by mouth daily.    . QUEtiapine (SEROQUEL) 25 MG tablet Take 25 mg by mouth 3 (three) times daily as needed (agitation).     . Vilazodone HCl (VIIBRYD) 40 MG TABS Take 40 mg by mouth daily.    Marland Kitchen zolpidem (AMBIEN) 10 MG tablet Take 10 mg by mouth at bedtime as needed for sleep.    Marland Kitchen HYDROcodone-acetaminophen (NORCO/VICODIN) 5-325 MG per tablet Take 1 tablet by mouth every 6 (six) hours as needed for moderate pain. (Patient not taking: Reported on 02/25/2015) 15 tablet 0  . oxyCODONE (OXY IR/ROXICODONE) 5 MG immediate release tablet Take 1-2 tablets (5-10 mg total) by mouth every 4 (four) hours as needed. (Patient not taking: Reported on 09/09/2014) 30 tablet 0  . oxyCODONE-acetaminophen (PERCOCET/ROXICET) 5-325 MG per tablet Take 1-2 tablets by mouth every 4 (four) hours as needed for moderate pain or severe pain. (Patient not taking: Reported on 10/26/2014) 20 tablet 0    Musculoskeletal: Strength & Muscle Tone: decreased Gait & Station:  unsteady Patient leans: N/A  Psychiatric Specialty Exam: Physical Exam  Review of Systems  Constitutional: Negative.   HENT: Negative.   Eyes: Negative.   Respiratory: Negative.   Cardiovascular: Negative.   Gastrointestinal: Negative.   Genitourinary:  Negative.   Skin: Negative.   Neurological: Negative.   Endo/Heme/Allergies: Negative.     Blood pressure 142/68, pulse 52, temperature 98.7 F (37.1 C), temperature source Oral, resp. rate 18, SpO2 98 %.There is no weight on file to calculate BMI.  General Appearance: Casual  Eye Contact::  Good  Speech:  Normal Rate  Volume:  Normal  Mood:  Anxious  Affect:  Congruent  Thought Process:  forgetful, poor memory  Orientation:  Full (Time, Place, and Person)  Thought Content:  WDL  Suicidal Thoughts:  No  Homicidal Thoughts:  No  Memory:  Immediate;   Fair Recent;   Poor Remote;   Fair  Judgement:  Fair  Insight:  Fair  Psychomotor Activity:  Decreased  Concentration:  Fair  Recall:  Poor  Fund of Knowledge:Good  Language: Good  Akathisia:  No  Handed:  Right  AIMS (if indicated):     Assets:  Housing Leisure Time Physical Health Resilience Social Support  ADL's:  Intact  Cognition: Impaired,  Moderate  Sleep:      Medical Decision Making: Review of Psycho-Social Stressors (1), Review or order clinical lab tests (1) and Review of Medication Regimen & Side Effects (2)  Treatment Plan Summary: Daily contact with patient to assess and evaluate symptoms and progress in treatment, Medication management and Plan admit to gero-psychiatry for stabilization  Plan:  Recommend psychiatric Inpatient admission when medically cleared. Disposition: Admit to gero-psychiatry  Waylan Boga, [employer] 02/27/2015 3:16 PM

## 2015-02-28 DIAGNOSIS — F0391 Unspecified dementia with behavioral disturbance: Secondary | ICD-10-CM | POA: Diagnosis not present

## 2015-02-28 DIAGNOSIS — F0390 Unspecified dementia without behavioral disturbance: Secondary | ICD-10-CM | POA: Diagnosis not present

## 2015-02-28 NOTE — BHH Counselor (Signed)
[facility] Assessment Progress Note  Verified with Delorise ShinerGrace at [place] that she still has the referral for pt and will give to the psychiatrist to review and will call back with decision.   Pamela ShockSamantha M. Ladona Ridgelaylor, MS, NCC Disposition Counselor

## 2015-02-28 NOTE — BH Assessment (Signed)
[facility] Assessment Progress Note  Per Delorise ShinerGrace at [facility], pt's bed is now available.  Please call report to [phone].  Pt's nurse and Nanine MeansJamison Lord, NP, have been notified.  Pt is under IVC and [facility]  Doylene Canninghomas Kamarri Lovvorn, MA Triage Specialist [phone]

## 2015-02-28 NOTE — Consult Note (Signed)
[facility] Face-to-Face Psychiatry Consult   Reason for Consult:  Agitation, aggression at times Referring Physician:  EDP Patient Identification: Pamela Cummings MRN:  [id number] Principal Diagnosis: Dementia with behavioral disturbance Diagnosis:   Patient Active Problem List   Diagnosis Date Noted  . Dementia with behavioral disturbance [F03.91] 02/27/2015    Priority: High  . Dementia [F03.90]     Total Time spent with patient: 30 minutes  Subjective:   Pamela Cummings is a 81 y.o. female patient admitted with agitation, behavior disturbances.  HPI:  The patient continues to wait for gero-psychiatry for a gero-psychiatry evaluation and medication management.  She has been accepted by Dr. Lowanda Foster to [facility], awaiting a bed assignment. HPI Elements:   Location:  generalized. Quality:  acute. Severity:  severe. Timing:  constant. Duration:  few days. Context:  stressors.  Past Medical History:  Past Medical History  Diagnosis Date  . Dementia   . Hypertension   . GERD (gastroesophageal reflux disease)     Past Surgical History  Procedure Laterality Date  . Cholecystectomy N/A 01/03/2013    Procedure: LAPAROSCOPIC CHOLECYSTECTOMY WITH INTRAOPERATIVE CHOLANGIOGRAM;  Surgeon: [name], MD;  [facility];  Service: General;  Laterality: N/A;   Family History: No family history on file. Social History:  History  Alcohol Use No     History  Drug Use Not on file    History   Social History  . Marital Status: Married    Spouse Name: N/A  . Number of Children: N/A  . Years of Education: N/A   Social History Main Topics  . Smoking status: Never Smoker   . Smokeless tobacco: Not on file  . Alcohol Use: No  . Drug Use: Not on file  . Sexual Activity: Not on file   Other Topics Concern  . None   Social History Narrative   Additional Social History:    Pain Medications: None Prescriptions: See MAR Over the Counter:  None History of alcohol / drug use?: No history of alcohol / drug abuse Longest period of sobriety (when/how long): NA                     Allergies:  No Known Allergies  Labs:  No results found for this or any previous visit (from the past 48 hour(s)).  Vitals: Blood pressure 130/61, pulse 51, temperature 98.5 F (36.9 C), temperature source Oral, resp. rate 18, SpO2 94 %.  Risk to Self: Suicidal Ideation: No Suicidal Intent: No Is patient at risk for suicide?: No Suicidal Plan?: No Access to Means: No What has been your use of drugs/alcohol within the last 12 months?: None How many times?: 0 Other Self Harm Risks: Pt is disorganized Triggers for Past Attempts: None known Intentional Self Injurious Behavior: None Risk to Others: Homicidal Ideation: No Thoughts of Harm to Others: No Current Homicidal Intent: No Current Homicidal Plan: No Access to Homicidal Means: No Identified Victim: None History of harm to others?: No Assessment of Violence: On admission Violent Behavior Description: Pt combative in ED Does patient have access to weapons?: No Criminal Charges Pending?: No Does patient have a court date: No Prior Inpatient Therapy: Prior Inpatient Therapy: No (Unable to assess) Prior Therapy Dates: NA Prior Therapy Facilty/Provider(s): NA Reason for Treatment: NA Prior Outpatient Therapy: Prior Outpatient Therapy: No (Unable to assess) Prior Therapy Dates: NA Prior Therapy Facilty/Provider(s): NA Reason for Treatment: NA Does patient have an ACCT team?: No Does  patient have Intensive In-House Services?  : No Does patient have [facility] services? : No Does patient have P4CC services?: No  Current Facility-Administered Medications  Medication Dose Route Frequency Provider Last Rate Last Dose  . acetaminophen (TYLENOL) tablet 650 mg  650 mg Oral Q4H PRN Raeford RazorStephen Kohut, MD      . atenolol (TENORMIN) tablet 25 mg  25 mg Oral Daily Raeford RazorStephen Kohut, MD   25 mg at  02/28/15 0931   And  . chlorthalidone (HYGROTON) tablet 12.5 mg  12.5 mg Oral Daily Raeford RazorStephen Kohut, MD   12.5 mg at 02/28/15 0932  . buPROPion (WELLBUTRIN SR) 12 hr tablet 100 mg  100 mg Oral Daily Craige CottaFernando A Cobos, MD   100 mg at 02/28/15 0931  . celecoxib (CELEBREX) capsule 200 mg  200 mg Oral Daily Raeford RazorStephen Kohut, MD   200 mg at 02/28/15 0931  . cephALEXin (KEFLEX) capsule 500 mg  500 mg Oral Q12H Jeffrey Hedges, PA-C   500 mg at 02/28/15 0933  . donepezil (ARICEPT) tablet 10 mg  10 mg Oral Daily Raeford RazorStephen Kohut, MD   10 mg at 02/28/15 0931  . estrogens (conjugated) (PREMARIN) tablet 0.625 mg  0.625 mg Oral Daily Raeford RazorStephen Kohut, MD   0.625 mg at 02/28/15 0931  . gabapentin (NEURONTIN) capsule 300 mg  300 mg Oral QHS Raeford RazorStephen Kohut, MD   300 mg at 02/27/15 2115  . levothyroxine (SYNTHROID, LEVOTHROID) tablet 25 mcg  25 mcg Oral QAC breakfast Raeford RazorStephen Kohut, MD   25 mcg at 02/28/15 0726  . LORazepam (ATIVAN) tablet 0.5 mg  0.5 mg Oral Q8H PRN Craige CottaFernando A Cobos, MD   0.5 mg at 02/28/15 0757  . memantine (NAMENDA) tablet 10 mg  10 mg Oral BID Raeford RazorStephen Kohut, MD   10 mg at 02/28/15 0932  . ondansetron (ZOFRAN) tablet 4 mg  4 mg Oral Q8H PRN Raeford RazorStephen Kohut, MD      . pantoprazole (PROTONIX) EC tablet 40 mg  40 mg Oral Daily Raeford RazorStephen Kohut, MD   40 mg at 02/28/15 0933  . QUEtiapine (SEROQUEL) tablet 12.5 mg  12.5 mg Oral BID PRN Craige CottaFernando A Cobos, MD   12.5 mg at 02/28/15 1408  . Vilazodone HCl (VIIBRYD) TABS 20 mg  20 mg Oral Q breakfast Craige CottaFernando A Cobos, MD   20 mg at 02/28/15 [id number]   Current Outpatient Prescriptions  Medication Sig Dispense Refill  . acetaminophen (TYLENOL) 500 MG tablet Take 1,000 mg by mouth every 6 (six) hours as needed for mild pain or headache.    Marland Kitchen. atenolol-chlorthalidone (TENORETIC) 50-25 MG per tablet Take 0.5 tablets by mouth daily.     Marland Kitchen. buPROPion (WELLBUTRIN SR) 150 MG 12 hr tablet Take 150 mg by mouth daily.    . celecoxib (CELEBREX) 200 MG capsule Take 200 mg by mouth daily.     Marland Kitchen. donepezil (ARICEPT) 10 MG tablet Take 10 mg by mouth daily.     Marland Kitchen. estrogens, conjugated, (PREMARIN) 0.625 MG tablet Take 0.625 mg by mouth daily.    Marland Kitchen. gabapentin (NEURONTIN) 300 MG capsule Take 300 mg by mouth at bedtime.     Marland Kitchen. levothyroxine (SYNTHROID, LEVOTHROID) 25 MCG tablet Take 25 mcg by mouth daily before breakfast.    . memantine (NAMENDA) 10 MG tablet Take 10 mg by mouth 2 (two) times daily.    Marland Kitchen. omeprazole (PRILOSEC) 20 MG capsule Take 20 mg by mouth daily.    . QUEtiapine (SEROQUEL) 25 MG tablet Take 25 mg by mouth  3 (three) times daily as needed (agitation).     . Vilazodone HCl (VIIBRYD) 40 MG TABS Take 40 mg by mouth daily.    Marland Kitchen zolpidem (AMBIEN) 10 MG tablet Take 10 mg by mouth at bedtime as needed for sleep.    Marland Kitchen HYDROcodone-acetaminophen (NORCO/VICODIN) 5-325 MG per tablet Take 1 tablet by mouth every 6 (six) hours as needed for moderate pain. (Patient not taking: Reported on 02/25/2015) 15 tablet 0  . oxyCODONE (OXY IR/ROXICODONE) 5 MG immediate release tablet Take 1-2 tablets (5-10 mg total) by mouth every 4 (four) hours as needed. (Patient not taking: Reported on 09/09/2014) 30 tablet 0  . oxyCODONE-acetaminophen (PERCOCET/ROXICET) 5-325 MG per tablet Take 1-2 tablets by mouth every 4 (four) hours as needed for moderate pain or severe pain. (Patient not taking: Reported on 10/26/2014) 20 tablet 0    Musculoskeletal: Strength & Muscle Tone: decreased Gait & Station: unsteady Patient leans: N/A  Psychiatric Specialty Exam: Physical Exam  Review of Systems  Constitutional: Negative.   HENT: Negative.   Eyes: Negative.   Respiratory: Negative.   Cardiovascular: Negative.   Gastrointestinal: Negative.   Genitourinary: Negative.   Musculoskeletal: Negative.   Skin: Negative.   Neurological: Negative.   Endo/Heme/Allergies: Negative.   Psychiatric/Behavioral: Positive for memory loss.    Blood pressure 130/61, pulse 51, temperature 98.5 F (36.9 C), temperature source  Oral, resp. rate 18, SpO2 94 %.There is no weight on file to calculate BMI.  General Appearance: Casual  Eye Contact::  Good  Speech:  Normal Rate  Volume:  Normal  Mood:  Anxious  Affect:  Congruent  Thought Process:  forgetful, poor memory  Orientation:  Full (Time, Place, and Person)  Thought Content:  WDL  Suicidal Thoughts:  No  Homicidal Thoughts:  No  Memory:  Immediate;   Fair Recent;   Poor Remote;   Fair  Judgement:  Fair  Insight:  Fair  Psychomotor Activity:  Decreased  Concentration:  Fair  Recall:  Poor  Fund of Knowledge:Good  Language: Good  Akathisia:  No  Handed:  Right  AIMS (if indicated):     Assets:  Housing Leisure Time Physical Health Resilience Social Support  ADL's:  Intact  Cognition: Impaired,  Moderate  Sleep:      Medical Decision Making: Review of Psycho-Social Stressors (1), Review or order clinical lab tests (1) and Review of Medication Regimen & Side Effects (2)  Treatment Plan Summary: Daily contact with patient to assess and evaluate symptoms and progress in treatment, Medication management and Plan admit to gero-psychiatry for stabilization  Plan:  Recommend psychiatric Inpatient admission when medically cleared. Disposition: Admit to gero-psychiatry at [place], [place]  Nanine Means, PMH-NP 02/28/2015 3:11 PM

## 2015-02-28 NOTE — Progress Notes (Signed)
[facility] pending bed by Dr. Lowanda FosterBeverly jones. Per Delorise ShinerGrace, pt bed expected to be available later today.   Olga CoasterKristen Pepper Wyndham, LCSW  Clinical Social Work  [facility] [phone]

## 2015-02-28 NOTE — ED Notes (Signed)
Patient's daughter in law took patient's necklace with her.

## 2015-02-28 NOTE — Progress Notes (Signed)
CSW was notified by nurse that the pt may have trouble with getting into the sheriff's van.  CSW reached out to [facility] who agrees that they will take pt to [place]. The sheriff will follow behind [facility].  Trish MageBrittney Filemon Breton, LCSWA [phone] ED CSW 02/28/2015 6:32 PM

## 2015-05-21 ENCOUNTER — Encounter (HOSPITAL_COMMUNITY): Payer: Self-pay | Admitting: Cardiology

## 2015-05-21 ENCOUNTER — Emergency Department (HOSPITAL_COMMUNITY)
Admission: EM | Admit: 2015-05-21 | Discharge: 2015-05-21 | Disposition: A | Discharge status: Home / Self Care | Payer: Medicare Other | Attending: Emergency Medicine | Admitting: Emergency Medicine

## 2015-05-21 ENCOUNTER — Emergency Department (HOSPITAL_COMMUNITY): Payer: Medicare Other

## 2015-05-21 DIAGNOSIS — E079 Disorder of thyroid, unspecified: Secondary | ICD-10-CM | POA: Insufficient documentation

## 2015-05-21 DIAGNOSIS — I10 Essential (primary) hypertension: Secondary | ICD-10-CM | POA: Diagnosis not present

## 2015-05-21 DIAGNOSIS — Z79899 Other long term (current) drug therapy: Secondary | ICD-10-CM | POA: Diagnosis not present

## 2015-05-21 DIAGNOSIS — F0390 Unspecified dementia without behavioral disturbance: Secondary | ICD-10-CM | POA: Diagnosis not present

## 2015-05-21 DIAGNOSIS — Z8719 Personal history of other diseases of the digestive system: Secondary | ICD-10-CM | POA: Diagnosis not present

## 2015-05-21 DIAGNOSIS — R55 Syncope and collapse: Secondary | ICD-10-CM | POA: Insufficient documentation

## 2015-05-21 HISTORY — DX: Disorder of thyroid, unspecified: E07.9

## 2015-05-21 LAB — COMPREHENSIVE METABOLIC PANEL
ALK PHOS: 56 U/L (ref 38–126)
ALT: 10 U/L — AB (ref 14–54)
ANION GAP: 8 (ref 5–15)
AST: 21 U/L (ref 15–41)
Albumin: 3.1 g/dL — ABNORMAL LOW (ref 3.5–5.0)
BUN: 15 mg/dL (ref 6–20)
CALCIUM: 8.9 mg/dL (ref 8.9–10.3)
CHLORIDE: 106 mmol/L (ref 101–111)
CO2: 27 mmol/L (ref 22–32)
CREATININE: 1.03 mg/dL — AB (ref 0.44–1.00)
GFR, EST AFRICAN AMERICAN: 58 mL/min — AB (ref >60)
GFR, EST NON AFRICAN AMERICAN: 50 mL/min — AB (ref >60)
Glucose, Bld: 109 mg/dL — ABNORMAL HIGH (ref 65–99)
Potassium: 3.6 mmol/L (ref 3.5–5.1)
SODIUM: 141 mmol/L (ref 135–145)
Total Bilirubin: 0.3 mg/dL (ref 0.3–1.2)
Total Protein: 5.8 g/dL — ABNORMAL LOW (ref 6.5–8.1)

## 2015-05-21 LAB — CBC WITH DIFFERENTIAL/PLATELET
Basophils Absolute: 0 10*3/uL (ref 0.0–0.1)
Basophils Relative: 1 %
EOS ABS: 0.1 10*3/uL (ref 0.0–0.7)
EOS PCT: 1 %
HCT: 35.2 % — ABNORMAL LOW (ref 36.0–46.0)
Hemoglobin: 11.7 g/dL — ABNORMAL LOW (ref 12.0–15.0)
LYMPHS ABS: 1.3 10*3/uL (ref 0.7–4.0)
LYMPHS PCT: 21 %
MCH: 30.4 pg (ref 26.0–34.0)
MCHC: 33.2 g/dL (ref 30.0–36.0)
MCV: 91.4 fL (ref 78.0–100.0)
MONOS PCT: 8 %
Monocytes Absolute: 0.5 10*3/uL (ref 0.1–1.0)
Neutro Abs: 4.4 10*3/uL (ref 1.7–7.7)
Neutrophils Relative %: 69 %
PLATELETS: 191 10*3/uL (ref 150–400)
RBC: 3.85 MIL/uL — ABNORMAL LOW (ref 3.87–5.11)
RDW: 13.7 % (ref 11.5–15.5)
WBC: 6.4 10*3/uL (ref 4.0–10.5)

## 2015-05-21 LAB — URINALYSIS, ROUTINE W REFLEX MICROSCOPIC
Bilirubin Urine: NEGATIVE
GLUCOSE, UA: NEGATIVE mg/dL
HGB URINE DIPSTICK: NEGATIVE
KETONES UR: NEGATIVE mg/dL
Leukocytes, UA: NEGATIVE
Nitrite: NEGATIVE
PROTEIN: NEGATIVE mg/dL
Specific Gravity, Urine: 1.015 (ref 1.005–1.030)
UROBILINOGEN UA: 0.2 mg/dL (ref 0.0–1.0)
pH: 5.5 (ref 5.0–8.0)

## 2015-05-21 LAB — TROPONIN I

## 2015-05-21 NOTE — ED Notes (Signed)
Pt to department via EMS from [facility]- pt reports she had syncope episode on the toilet. Pt had snoring respirations with staff and was alert on EMS arrival. Pt did not fall off the toilet was held by staff. Hr-54 Bp-128/63 RR-18 Cbg-130 20 left wrist.

## 2015-05-21 NOTE — ED Notes (Signed)
Patient placed on bedpan.

## 2015-05-21 NOTE — Discharge Instructions (Signed)
Vasovagal Syncope, Adult °Syncope, commonly known as fainting, is a temporary loss of consciousness. It occurs when the blood flow to the brain is reduced. Vasovagal syncope (also called neurocardiogenic syncope) is a fainting spell in which the blood flow to the brain is reduced because of a sudden drop in heart rate and blood pressure. Vasovagal syncope occurs when the brain and the cardiovascular system (blood vessels) do not adequately communicate and respond to each other. This is the most common cause of fainting. It often occurs in response to fear or some other type of emotional or physical stress. The body has a reaction in which the heart starts beating too slowly or the blood vessels expand, reducing blood pressure. This type of fainting spell is generally considered harmless. However, injuries can occur if a person takes a sudden fall during a fainting spell.  °CAUSES  °Vasovagal syncope occurs when a person's blood pressure and heart rate decrease suddenly, usually in response to a trigger. Many things and situations can trigger an episode. Some of these include:  °· Pain.   °· Fear.   °· The sight of blood or medical procedures, such as blood being drawn from a vein.   °· Common activities, such as coughing, swallowing, stretching, or going to the bathroom.   °· Emotional stress.   °· Prolonged standing, especially in a warm environment.   °· Lack of sleep or rest.   °· Prolonged lack of food.   °· Prolonged lack of fluids.   °· Recent illness. °· The use of certain drugs that affect blood pressure, such as cocaine, alcohol, marijuana, inhalants, and opiates.   °SYMPTOMS  °Before the fainting episode, you may:  °· Feel dizzy or light headed.   °· Become pale. °· Sense that you are going to faint.   °· Feel like the room is spinning.   °· Have tunnel vision, only seeing directly in front of you.   °· Feel sick to your stomach (nauseous).   °· See spots or slowly lose vision.   °· Hear ringing in your  ears.   °· Have a headache.   °· Feel warm and sweaty.   °· Feel a sensation of pins and needles. °During the fainting spell, you will generally be unconscious for no longer than a couple minutes before waking up and returning to normal. If you get up too quickly before your body can recover, you may faint again. Some twitching or jerky movements may occur during the fainting spell.  °DIAGNOSIS  °Your caregiver will ask about your symptoms, take a medical history, and perform a physical exam. Various tests may be done to rule out other causes of fainting. These may include blood tests and tests to check the heart, such as electrocardiography, echocardiography, and possibly an electrophysiology study. When other causes have been ruled out, a test may be done to check the body's response to changes in position (tilt table test). °TREATMENT  °Most cases of vasovagal syncope do not require treatment. Your caregiver may recommend ways to avoid fainting triggers and may provide home strategies for preventing fainting. If you must be exposed to a possible trigger, you can drink additional fluids to help reduce your chances of having an episode of vasovagal syncope. If you have warning signs of an oncoming episode, you can respond by positioning yourself favorably (lying down). °If your fainting spells continue, you may be given medicines to prevent fainting. Some medicines may help make you more resistant to repeated episodes of vasovagal syncope. Special exercises or compression stockings may be recommended. In rare cases, the surgical placement   of a pacemaker is considered. °HOME CARE INSTRUCTIONS  °· Learn to identify the warning signs of vasovagal syncope.   °· Sit or lie down at the first warning sign of a fainting spell. If sitting, put your head down between your legs. If you lie down, swing your legs up in the air to increase blood flow to the brain.   °· Avoid hot tubs and saunas. °· Avoid prolonged  standing. °· Drink enough fluids to keep your urine clear or pale yellow. Avoid caffeine. °· Increase salt in your diet as directed by your caregiver.   °· If you have to stand for a long time, perform movements such as:   °¨ Crossing your legs.   °¨ Flexing and stretching your leg muscles.   °¨ Squatting.   °¨ Moving your legs.   °¨ Bending over.   °· Only take over-the-counter or prescription medicines as directed by your caregiver. Do not suddenly stop any medicines without asking your caregiver first.  °SEEK MEDICAL CARE IF:  °· Your fainting spells continue or happen more frequently in spite of treatment.   °· You lose consciousness for more than a couple minutes. °· You have fainting spells during or after exercising or after being startled.   °· You have new symptoms that occur with the fainting spells, such as:   °¨ Shortness of breath. °¨ Chest pain.   °¨ Irregular heartbeat.   °· You have episodes of twitching or jerky movements that last longer than a few seconds. °· You have episodes of twitching or jerky movements without obvious fainting. °SEEK IMMEDIATE MEDICAL CARE IF:  °· You have injuries or bleeding after a fainting spell.   °· You have episodes of twitching or jerky movements that last longer than 5 minutes.   °· You have more than one spell of twitching or jerky movements before returning to consciousness after fainting. °MAKE SURE YOU:  °· Understand these instructions. °· Will watch your condition. °· Will get help right away if you are not doing well or get worse. °Document Released: 07/28/2012 Document Reviewed: 07/28/2012 °[employer]® Patient Information ©2015 [employer]. This information is not intended to replace advice given to you by your health care provider. Make sure you discuss any questions you have with your health care provider. ° °

## 2015-05-21 NOTE — ED Provider Notes (Signed)
CSN: [id number]     Arrival date & time 05/21/15  4098 History   First MD Initiated Contact with Patient 05/21/15 0957     Chief Complaint  Patient presents with  . Loss of Consciousness     (Consider location/radiation/quality/duration/timing/severity/associated sxs/prior Treatment) HPI Comments: Presents to the ER for evaluation of syncope. Patient was reportedly on the toilet having a bowel movement when she passed out. She did not fall, was held up by caregivers. Caregivers report that she was non-arousable, was snoring. By the time EMS arrived on the scene, however, she was back to her baseline. At arrival to the ER, patient does not remember the event. She does, however, deny all symptoms. She feels fine, has no complaints. Denies chest pain, shortness of breath, abdominal pain.  Patient is a 81 y.o. female presenting with syncope.  Loss of Consciousness   Past Medical History  Diagnosis Date  . Dementia   . Hypertension   . GERD (gastroesophageal reflux disease)   . Thyroid disease    Past Surgical History  Procedure Laterality Date  . Cholecystectomy N/A 01/03/2013    Procedure: LAPAROSCOPIC CHOLECYSTECTOMY WITH INTRAOPERATIVE CHOLANGIOGRAM;  Surgeon: [name], MD;  Location: [facility] OR;  Service: General;  Laterality: N/A;   History reviewed. No pertinent family history. Social History  Substance Use Topics  . Smoking status: Never Smoker   . Smokeless tobacco: None  . Alcohol Use: No   OB History    No data available     Review of Systems  Cardiovascular: Positive for syncope.  Neurological: Positive for syncope.  All other systems reviewed and are negative.     Allergies  Demerol and Sulfa antibiotics  Home Medications   Prior to Admission medications   Medication Sig Start Date End Date Taking? Authorizing Provider  acetaminophen (TYLENOL) 500 MG tablet Take 1,000 mg by mouth every 6 (six) hours as needed for mild pain or headache.   Yes Historical  Provider, MD  amLODipine (NORVASC) 10 MG tablet Take 10 mg by mouth daily.   Yes Historical Provider, MD  cyanocobalamin (,VITAMIN B-12,) 1000 MCG/ML injection Inject 1,000 mcg into the muscle every 30 (thirty) days.   Yes Historical Provider, MD  donepezil (ARICEPT) 10 MG tablet Take 10 mg by mouth daily.    Yes Historical Provider, MD  estrogens, conjugated, (PREMARIN) 0.625 MG tablet Take 0.625 mg by mouth daily.   Yes Historical Provider, MD  famotidine (PEPCID) 20 MG tablet Take 20 mg by mouth 2 (two) times daily.   Yes Historical Provider, MD  gabapentin (NEURONTIN) 300 MG capsule Take 300 mg by mouth at bedtime.    Yes Historical Provider, MD  levothyroxine (SYNTHROID, LEVOTHROID) 25 MCG tablet Take 25 mcg by mouth daily before breakfast.   Yes Historical Provider, MD  memantine (NAMENDA) 10 MG tablet Take 10 mg by mouth 2 (two) times daily.   Yes Historical Provider, MD  QUEtiapine (SEROQUEL) 25 MG tablet Take 25 mg by mouth 2 (two) times daily.    Yes Historical Provider, MD  QUEtiapine (SEROQUEL) 50 MG tablet Take 50 mg by mouth 2 (two) times daily.   Yes Historical Provider, MD  Vilazodone HCl (VIIBRYD) 40 MG TABS Take 40 mg by mouth daily.   Yes Historical Provider, MD  Vitamin D, Ergocalciferol, (DRISDOL) 50000 UNITS CAPS capsule Take 50,000 Units by mouth every 7 (seven) days.   Yes Historical Provider, MD  zolpidem (AMBIEN) 10 MG tablet Take 10 mg by mouth at  bedtime as needed for sleep.   Yes Historical Provider, MD   BP 121/54 mmHg  Pulse 58  Temp(Src) 98.2 F (36.8 C) (Oral)  Resp 17  Wt 178 lb (80.74 kg)  SpO2 92% Physical Exam  Constitutional: She is oriented to person, place, and time. She appears well-developed and well-nourished. No distress.  HENT:  Head: Normocephalic and atraumatic.  Right Ear: Hearing normal.  Left Ear: Hearing normal.  Nose: Nose normal.  Mouth/Throat: Oropharynx is clear and moist and mucous membranes are normal.  Eyes: Conjunctivae and EOM  are normal. Pupils are equal, round, and reactive to light.  Neck: Normal range of motion. Neck supple.  Cardiovascular: Regular rhythm, S1 normal and S2 normal.  Exam reveals no gallop and no friction rub.   No murmur heard. Pulmonary/Chest: Effort normal and breath sounds normal. No respiratory distress. She exhibits no tenderness.  Abdominal: Soft. Normal appearance and bowel sounds are normal. There is no hepatosplenomegaly. There is no tenderness. There is no rebound, no guarding, no tenderness at McBurney's point and negative Murphy's sign. No hernia.  Musculoskeletal: Normal range of motion.  Neurological: She is alert and oriented to person, place, and time. She has normal strength. No cranial nerve deficit or sensory deficit. Coordination normal. GCS eye subscore is 4. GCS verbal subscore is 5. GCS motor subscore is 6.  Skin: Skin is warm, dry and intact. No rash noted. No cyanosis.  Psychiatric: She has a normal mood and affect. Her speech is normal and behavior is normal. Thought content normal.  Nursing note and vitals reviewed.   ED Course  Procedures (including critical care time) Labs Review Labs Reviewed  CBC WITH DIFFERENTIAL/PLATELET - Abnormal; Notable for the following:    RBC 3.85 (*)    Hemoglobin 11.7 (*)    HCT 35.2 (*)    All other components within normal limits  COMPREHENSIVE METABOLIC PANEL - Abnormal; Notable for the following:    Glucose, Bld 109 (*)    Creatinine, Ser 1.03 (*)    Total Protein 5.8 (*)    Albumin 3.1 (*)    ALT 10 (*)    GFR calc non Af Amer 50 (*)    GFR calc Af Amer 58 (*)    All other components within normal limits  TROPONIN I  URINALYSIS, ROUTINE W REFLEX MICROSCOPIC (NOT AT [facility])    Imaging Review Ct Head Wo Contrast  05/21/2015   CLINICAL DATA:  Syncopal episode on the toilet.  Did not fall.  EXAM: CT HEAD WITHOUT CONTRAST  TECHNIQUE: Contiguous axial images were obtained from the base of the skull through the vertex without  intravenous contrast.  COMPARISON:  MR brain 12/04/2005  FINDINGS: There is no evidence of mass effect, midline shift, or extra-axial fluid collections. There is no evidence of a space-occupying lesion or intracranial hemorrhage. There is no evidence of a cortical-based area of acute infarction. There is generalized cerebral atrophy. There is periventricular white matter low attenuation likely secondary to microangiopathy.  The ventricles and sulci are appropriate for the patient's age. The basal cisterns are patent.  Visualized portions of the orbits are unremarkable. The visualized portions of the paranasal sinuses and mastoid air cells are unremarkable.  The osseous structures are unremarkable.  IMPRESSION: 1. No acute intracranial pathology. 2. Chronic microvascular disease and cerebral atrophy.   Electronically Signed   By: Elige Ko   On: 05/21/2015 10:44   I have personally reviewed and evaluated these images and lab results  as part of my medical decision-making.   EKG Interpretation   Date/Time:  Monday May 21 2015 10:00:07 EDT Ventricular Rate:  52 PR Interval:  168 QRS Duration: 107 QT Interval:  530 QTC Calculation: 493 R Axis:   -40 Text Interpretation:  Sinus rhythm Low voltage, precordial leads Probable  anterolateral infarct, old No significant change since last tracing  Confirmed by POLLINA  MD, CHRISTOPHER [phone]) on 05/21/2015 10:06:07 AM      MDM   Final diagnoses:  Vasovagal syncope    Patient sent to the ER for evaluation of syncope. Patient was unable to give me information at arrival because of a history of dementia, but patient's daughter and husband did arrive in the ER and provides further information. They report that the patient was actively straining to have a bowel movement, then became acutely nauseated and felt like she was going to vomit before the syncopal episode. She was out for 1 minute, was exhibiting confused and garbled speech initially. There  was no seizure-like activity. She has been awake, alert and oriented since the event. Vital signs are all normal. Symptoms are most consistent with vasovagal syncope.    Gilda Crease, MD 05/21/15 [phone]

## 2015-09-08 ENCOUNTER — Other Ambulatory Visit
Admission: RE | Admit: 2015-09-08 | Discharge: 2015-09-08 | Disposition: A | Discharge status: Home / Self Care | Payer: Medicare Other | Source: Ambulatory Visit | Attending: Family Medicine | Admitting: Family Medicine

## 2015-09-08 LAB — URINALYSIS COMPLETE WITH MICROSCOPIC (ARMC ONLY)
BILIRUBIN URINE: NEGATIVE
Glucose, UA: NEGATIVE mg/dL
HGB URINE DIPSTICK: NEGATIVE
KETONES UR: NEGATIVE mg/dL
Nitrite: POSITIVE — AB
PH: 5 (ref 5.0–8.0)
Protein, ur: NEGATIVE mg/dL
Specific Gravity, Urine: 1.012 (ref 1.005–1.030)

## 2015-09-11 LAB — URINE CULTURE: Culture: >=100000

## 2015-09-29 ENCOUNTER — Encounter (HOSPITAL_COMMUNITY): Payer: Self-pay | Admitting: *Deleted

## 2015-09-29 ENCOUNTER — Emergency Department (HOSPITAL_COMMUNITY)
Admission: EM | Admit: 2015-09-29 | Discharge: 2015-09-30 | Disposition: A | Discharge status: Home / Self Care | Payer: Medicare Other | Attending: Emergency Medicine | Admitting: Emergency Medicine

## 2015-09-29 DIAGNOSIS — Y92128 Other place in nursing home as the place of occurrence of the external cause: Secondary | ICD-10-CM | POA: Insufficient documentation

## 2015-09-29 DIAGNOSIS — S79911A Unspecified injury of right hip, initial encounter: Secondary | ICD-10-CM | POA: Diagnosis present

## 2015-09-29 DIAGNOSIS — Z79899 Other long term (current) drug therapy: Secondary | ICD-10-CM | POA: Diagnosis not present

## 2015-09-29 DIAGNOSIS — E079 Disorder of thyroid, unspecified: Secondary | ICD-10-CM | POA: Insufficient documentation

## 2015-09-29 DIAGNOSIS — Z7952 Long term (current) use of systemic steroids: Secondary | ICD-10-CM | POA: Diagnosis not present

## 2015-09-29 DIAGNOSIS — Y998 Other external cause status: Secondary | ICD-10-CM | POA: Insufficient documentation

## 2015-09-29 DIAGNOSIS — Z8669 Personal history of other diseases of the nervous system and sense organs: Secondary | ICD-10-CM | POA: Insufficient documentation

## 2015-09-29 DIAGNOSIS — K219 Gastro-esophageal reflux disease without esophagitis: Secondary | ICD-10-CM | POA: Diagnosis not present

## 2015-09-29 DIAGNOSIS — W1839XA Other fall on same level, initial encounter: Secondary | ICD-10-CM | POA: Diagnosis not present

## 2015-09-29 DIAGNOSIS — F0390 Unspecified dementia without behavioral disturbance: Secondary | ICD-10-CM | POA: Insufficient documentation

## 2015-09-29 DIAGNOSIS — W19XXXA Unspecified fall, initial encounter: Secondary | ICD-10-CM

## 2015-09-29 DIAGNOSIS — Z961 Presence of intraocular lens: Secondary | ICD-10-CM | POA: Diagnosis not present

## 2015-09-29 DIAGNOSIS — I10 Essential (primary) hypertension: Secondary | ICD-10-CM | POA: Insufficient documentation

## 2015-09-29 DIAGNOSIS — Y9389 Activity, other specified: Secondary | ICD-10-CM | POA: Diagnosis not present

## 2015-09-29 HISTORY — DX: Bell's palsy: G51.0

## 2015-09-29 NOTE — ED Notes (Signed)
Pt is from [facility]. Pt has had two falls tonight (unwittnessed). No LOC, no blood thinners. C/o pain to the right hip, no shortening or rotation. Pt was able to stand with assist (at baseline for pt). Does normally use w/c for assistance. Hx of dementia, mental status is at baseline at this time. BP 208/80, hr 52 en route, hx of HTN.

## 2015-09-29 NOTE — ED Provider Notes (Signed)
CSN: [id number]     Arrival date & time 09/29/15  2352 History  By signing my name below, I, Bethel Born, attest that this documentation has been prepared under the direction and in the presence of Jahzier Villalon, MD. Electronically Signed: Bethel Born, ED Scribe. 09/30/2015. 12:19 AM   Chief Complaint  Patient presents with  . Fall   Level V caveat due to dementia   Patient is a 82 y.o. female presenting with fall. The history is provided by a relative. No language interpreter was used.  Fall This is a new problem. The current episode started 1 to 2 hours ago. The problem occurs constantly. The problem has not changed since onset.Nothing aggravates the symptoms. Nothing relieves the symptoms. She has tried nothing for the symptoms. The treatment provided no relief.   Brought in by EMS from [facility], Pamela Cummings is a 81 y.o. female with history of dementia who presents to the Emergency Department with a relative complaining of an unwitnessed fall tonight at her facility. Per relative the pt was found on her back in her bathroom complaining of hip pain. Relative states that the pt falls frequently.    Past Medical History  Diagnosis Date  . Dementia   . Hypertension   . GERD (gastroesophageal reflux disease)   . Thyroid disease   . Bell's palsy    Past Surgical History  Procedure Laterality Date  . Cholecystectomy N/A 01/03/2013    Procedure: LAPAROSCOPIC CHOLECYSTECTOMY WITH INTRAOPERATIVE CHOLANGIOGRAM;  Surgeon: [name], MD;  Location: [facility] OR;  Service: General;  Laterality: N/A;   History reviewed. No pertinent family history. Social History  Substance Use Topics  . Smoking status: Never Smoker   . Smokeless tobacco: None  . Alcohol Use: No   OB History    No data available     Review of Systems  Unable to perform ROS: Dementia   Allergies  Demerol and Sulfa antibiotics  Home Medications   Prior to Admission medications   Medication Sig  Start Date End Date Taking? Authorizing Provider  acetaminophen (TYLENOL) 500 MG tablet Take 1,000 mg by mouth every 6 (six) hours as needed for mild pain or headache.   Yes Historical Provider, MD  amLODipine (NORVASC) 10 MG tablet Take 10 mg by mouth daily.   Yes Historical Provider, MD  aspirin EC 81 MG tablet Take 81 mg by mouth daily.   Yes Historical Provider, MD  atenolol (TENORMIN) 25 MG tablet Take 12.5 mg by mouth daily.   Yes Historical Provider, MD  carbamide peroxide (DEBROX) 6.5 % otic solution Place 5-10 drops into both ears 2 (two) times daily.   Yes Historical Provider, MD  Cranberry 400 MG TABS Take 1 tablet by mouth daily.   Yes Historical Provider, MD  cyclobenzaprine (FLEXERIL) 5 MG tablet Take 5 mg by mouth daily as needed for muscle spasms.   Yes Historical Provider, MD  donepezil (ARICEPT) 10 MG tablet Take 10 mg by mouth daily.    Yes Historical Provider, MD  estrogens, conjugated, (PREMARIN) 0.625 MG tablet Take 0.625 mg by mouth daily.   Yes Historical Provider, MD  famotidine (PEPCID) 20 MG tablet Take 20 mg by mouth 2 (two) times daily.   Yes Historical Provider, MD  haloperidol (HALDOL) 1 MG tablet Take 1 mg by mouth 2 (two) times daily.   Yes Historical Provider, MD  levothyroxine (SYNTHROID, LEVOTHROID) 25 MCG tablet Take 25 mcg by mouth daily before breakfast.   Yes  Historical Provider, MD  loratadine (CLARITIN) 10 MG tablet Take 10 mg by mouth daily.   Yes Historical Provider, MD  memantine (NAMENDA) 10 MG tablet Take 10 mg by mouth 2 (two) times daily.   Yes Historical Provider, MD  PRESCRIPTION MEDICATION Apply 1 application topically every 8 (eight) hours as needed (FOR AGITATION). LORAZEPAM CREAM 1 MG/ML   Yes Historical Provider, MD  QUEtiapine (SEROQUEL) 50 MG tablet Take 50 mg by mouth 3 (three) times daily.    Yes Historical Provider, MD  Vilazodone HCl (VIIBRYD) 40 MG TABS Take 40 mg by mouth daily.   Yes Historical Provider, MD  vitamin B-12  (CYANOCOBALAMIN) 1000 MCG tablet Take 1,000 mcg by mouth daily.   Yes Historical Provider, MD  Vitamin D, Ergocalciferol, (DRISDOL) 50000 UNITS CAPS capsule Take 50,000 Units by mouth every 7 (seven) days. wednesday   Yes Historical Provider, MD  zolpidem (AMBIEN) 10 MG tablet Take 10 mg by mouth at bedtime as needed for sleep.   Yes Historical Provider, MD   BP 175/68 mmHg  Pulse 51  Temp(Src) 98.1 F (36.7 C) (Oral)  Resp 18  SpO2 100% Physical Exam  Constitutional: She is oriented to person, place, and time. She appears well-developed and well-nourished.  HENT:  Head: Normocephalic.  Right Ear: No mastoid tenderness. No hemotympanum.  Left Ear: No mastoid tenderness. No hemotympanum.  Mouth/Throat: Oropharynx is clear and moist.  Moist mucous membranes No exudate No hemotympanum bilaterally  Eyes: EOM are normal. Pupils are equal, round, and reactive to light.  Bilateral lens implant  Neck: Normal range of motion. Neck supple.  Trachea midline  Cardiovascular: Normal rate, regular rhythm and intact distal pulses.   Pulmonary/Chest: Effort normal and breath sounds normal. She has no wheezes. She has no rales.  Abdominal: Soft. Bowel sounds are normal. She exhibits no mass. There is no tenderness. There is no rebound and no guarding.  Musculoskeletal: Normal range of motion. She exhibits no edema or tenderness.  No external rotation or foreshortening of either LE. DP/PT pulses intact. No pain over either trochanter.  Neurological: She is alert and oriented to person, place, and time. She has normal reflexes.  Skin: Skin is warm and dry.  Psychiatric: She has a normal mood and affect. Her behavior is normal.  Nursing note and vitals reviewed.   ED Course  Procedures (including critical care time) DIAGNOSTIC STUDIES: Oxygen Saturation is 100% on RA,  normal by my interpretation.    COORDINATION OF CARE: 12:16 AM Discussed treatment plan which includes CT head without  contrast, CT cervical spine without contrast, XR of the hips and pelvis, and CXR with pt and relative at bedside and they agreed to plan.  Labs Review Labs Reviewed - No data to display  Imaging Review Dg Chest 2 View  09/30/2015  CLINICAL DATA:  Unwitnessed fall tonight.  Initial encounter. EXAM: CHEST  2 VIEW COMPARISON:  02/27/2015 FINDINGS: Mild left perihilar atelectasis. There is no edema, consolidation, effusion, or pneumothorax. Normal heart size and stable aortic contours. No visible fracture. IMPRESSION: No active cardiopulmonary disease. Electronically Signed   By: Marnee Spring M.D.   On: 09/30/2015 01:13   Ct Head Wo Contrast  09/30/2015  CLINICAL DATA:  Two unwitnessed falls tonight. No loss of consciousness, not on blood thinners. History of dementia, hypertension. EXAM: CT HEAD WITHOUT CONTRAST CT CERVICAL SPINE WITHOUT CONTRAST TECHNIQUE: Multidetector CT imaging of the head and cervical spine was performed following the standard protocol without intravenous contrast.  Multiplanar CT image reconstructions of the cervical spine were also generated. COMPARISON:  CT head May 21, 2015 FINDINGS: CT HEAD FINDINGS Moderate to severe ventriculomegaly on the basis of global parenchymal brain volume loss, stable from prior imaging. No Intraparenchymal hemorrhage, mass effect nor midline shift. Patchy supratentorial white matter hypodensities are within normal range for patient's age and though non-specific suggest sequelae of chronic small vessel ischemic disease. No acute large vascular territory infarcts. No abnormal extra-axial fluid collections. Basal cisterns are patent. Mild calcific atherosclerosis of the carotid siphons. No skull fracture. The included ocular globes and orbital contents are non-suspicious. The mastoid aircells and included paranasal sinuses are well-aerated. Severe RIGHT temporomandibular osteoarthrosis. CT CERVICAL SPINE FINDINGS Cervical vertebral bodies intact. Grade  1 C4-5 anterolisthesis on degenerative basis. Multilevel severe facet arthropathy with diffuse C3-4 facets on degenerative basis. C1-2 articulation maintained with severe arthropathy. Moderate to severe C4-5 through C6-7 disc height loss and endplate spurring compatible with degenerative discs. Congenitally capacious canal. No destructive bony lesions. Severe LEFT C3-4, moderate to severe bilateral C5-6 and LEFT C6-7 neural foraminal narrowing. No prevertebral soft tissue swelling. IMPRESSION: CT HEAD: No acute intracranial process. Stable appearance of the head: Moderate to severe global brain atrophy with moderate to severe chronic small vessel ischemic disease. CT CERVICAL SPINE: No acute fracture. Grade 1 C4-5 anterolisthesis on degenerative basis. Electronically Signed   By: Awilda Metro M.D.   On: 09/30/2015 01:16   Ct Cervical Spine Wo Contrast  09/30/2015  CLINICAL DATA:  Two unwitnessed falls tonight. No loss of consciousness, not on blood thinners. History of dementia, hypertension. EXAM: CT HEAD WITHOUT CONTRAST CT CERVICAL SPINE WITHOUT CONTRAST TECHNIQUE: Multidetector CT imaging of the head and cervical spine was performed following the standard protocol without intravenous contrast. Multiplanar CT image reconstructions of the cervical spine were also generated. COMPARISON:  CT head May 21, 2015 FINDINGS: CT HEAD FINDINGS Moderate to severe ventriculomegaly on the basis of global parenchymal brain volume loss, stable from prior imaging. No Intraparenchymal hemorrhage, mass effect nor midline shift. Patchy supratentorial white matter hypodensities are within normal range for patient's age and though non-specific suggest sequelae of chronic small vessel ischemic disease. No acute large vascular territory infarcts. No abnormal extra-axial fluid collections. Basal cisterns are patent. Mild calcific atherosclerosis of the carotid siphons. No skull fracture. The included ocular globes and  orbital contents are non-suspicious. The mastoid aircells and included paranasal sinuses are well-aerated. Severe RIGHT temporomandibular osteoarthrosis. CT CERVICAL SPINE FINDINGS Cervical vertebral bodies intact. Grade 1 C4-5 anterolisthesis on degenerative basis. Multilevel severe facet arthropathy with diffuse C3-4 facets on degenerative basis. C1-2 articulation maintained with severe arthropathy. Moderate to severe C4-5 through C6-7 disc height loss and endplate spurring compatible with degenerative discs. Congenitally capacious canal. No destructive bony lesions. Severe LEFT C3-4, moderate to severe bilateral C5-6 and LEFT C6-7 neural foraminal narrowing. No prevertebral soft tissue swelling. IMPRESSION: CT HEAD: No acute intracranial process. Stable appearance of the head: Moderate to severe global brain atrophy with moderate to severe chronic small vessel ischemic disease. CT CERVICAL SPINE: No acute fracture. Grade 1 C4-5 anterolisthesis on degenerative basis. Electronically Signed   By: Awilda Metro M.D.   On: 09/30/2015 01:16   Dg Hips Bilat With Pelvis 2v  09/30/2015  CLINICAL DATA:  Unwitnessed fall with bilateral hip pain. Initial encounter. EXAM: DG HIP (WITH OR WITHOUT PELVIS) 2V BILAT COMPARISON:  None. FINDINGS: There is no evidence of hip fracture or dislocation. No hip joint narrowing. No evidence  of pelvic ring fracture or diastasis. Osteopenia. IMPRESSION: Negative. Electronically Signed   By: Marnee Spring M.D.   On: 09/30/2015 01:14   I have personally reviewed and evaluated these lab results as part of my medical decision-making.   EKG Interpretation None      MDM   Final diagnoses:  None    No fractures.  Tylenol for pain.  Will transfer back to patient's facility   I personally performed the services described in this documentation, which was scribed in my presence. The recorded information has been reviewed and is accurate.      Cy Blamer, MD 09/30/15  [phone]

## 2015-09-30 ENCOUNTER — Emergency Department (HOSPITAL_COMMUNITY): Payer: Medicare Other

## 2015-09-30 ENCOUNTER — Encounter (HOSPITAL_COMMUNITY): Payer: Self-pay | Admitting: Emergency Medicine

## 2015-09-30 DIAGNOSIS — S79911A Unspecified injury of right hip, initial encounter: Secondary | ICD-10-CM | POA: Diagnosis not present

## 2015-09-30 MED ORDER — ACETAMINOPHEN 500 MG PO TABS
1000.0000 mg | ORAL_TABLET | Freq: Once | ORAL | Status: AC
  Administered 2015-09-30: 1000 mg via ORAL
  Filled 2015-09-30: qty 2

## 2015-09-30 NOTE — Discharge Instructions (Signed)

## 2016-02-24 ENCOUNTER — Encounter (HOSPITAL_COMMUNITY): Payer: Self-pay | Admitting: Emergency Medicine

## 2016-02-24 ENCOUNTER — Emergency Department (HOSPITAL_COMMUNITY)
Admission: EM | Admit: 2016-02-24 | Discharge: 2016-02-24 | Disposition: A | Discharge status: Home / Self Care | Payer: Medicare Other | Attending: Emergency Medicine | Admitting: Emergency Medicine

## 2016-02-24 DIAGNOSIS — Z79899 Other long term (current) drug therapy: Secondary | ICD-10-CM | POA: Diagnosis not present

## 2016-02-24 DIAGNOSIS — R600 Localized edema: Secondary | ICD-10-CM | POA: Insufficient documentation

## 2016-02-24 DIAGNOSIS — R197 Diarrhea, unspecified: Secondary | ICD-10-CM | POA: Diagnosis not present

## 2016-02-24 DIAGNOSIS — R001 Bradycardia, unspecified: Secondary | ICD-10-CM | POA: Insufficient documentation

## 2016-02-24 DIAGNOSIS — I10 Essential (primary) hypertension: Secondary | ICD-10-CM | POA: Diagnosis not present

## 2016-02-24 DIAGNOSIS — R1110 Vomiting, unspecified: Secondary | ICD-10-CM | POA: Insufficient documentation

## 2016-02-24 LAB — COMPREHENSIVE METABOLIC PANEL
ALK PHOS: 56 U/L (ref 38–126)
ALT: 11 U/L — AB (ref 14–54)
AST: 17 U/L (ref 15–41)
Albumin: 3.3 g/dL — ABNORMAL LOW (ref 3.5–5.0)
Anion gap: 8 (ref 5–15)
BUN: 13 mg/dL (ref 6–20)
CALCIUM: 9 mg/dL (ref 8.9–10.3)
CO2: 26 mmol/L (ref 22–32)
CREATININE: 0.95 mg/dL (ref 0.44–1.00)
Chloride: 104 mmol/L (ref 101–111)
GFR calc non Af Amer: 55 mL/min — ABNORMAL LOW (ref >60)
Glucose, Bld: 113 mg/dL — ABNORMAL HIGH (ref 65–99)
Potassium: 4.2 mmol/L (ref 3.5–5.1)
SODIUM: 138 mmol/L (ref 135–145)
Total Bilirubin: 0.3 mg/dL (ref 0.3–1.2)
Total Protein: 6.5 g/dL (ref 6.5–8.1)

## 2016-02-24 LAB — CBC
HEMATOCRIT: 39.6 % (ref 36.0–46.0)
HEMOGLOBIN: 12.3 g/dL (ref 12.0–15.0)
MCH: 29 pg (ref 26.0–34.0)
MCHC: 31.1 g/dL (ref 30.0–36.0)
MCV: 93.4 fL (ref 78.0–100.0)
Platelets: 197 10*3/uL (ref 150–400)
RBC: 4.24 MIL/uL (ref 3.87–5.11)
RDW: 14.2 % (ref 11.5–15.5)
WBC: 8.7 10*3/uL (ref 4.0–10.5)

## 2016-02-24 LAB — URINALYSIS, ROUTINE W REFLEX MICROSCOPIC
BILIRUBIN URINE: NEGATIVE
GLUCOSE, UA: NEGATIVE mg/dL
HGB URINE DIPSTICK: NEGATIVE
Ketones, ur: 15 mg/dL — AB
Leukocytes, UA: NEGATIVE
Nitrite: NEGATIVE
PH: 6 (ref 5.0–8.0)
Protein, ur: NEGATIVE mg/dL
SPECIFIC GRAVITY, URINE: 1.026 (ref 1.005–1.030)

## 2016-02-24 LAB — LIPASE, BLOOD: Lipase: 14 U/L (ref 11–51)

## 2016-02-24 LAB — TROPONIN I: Troponin I: <0.03 ng/mL (ref <0.03)

## 2016-02-24 MED ORDER — PROMETHAZINE HCL 25 MG PO TABS: 12.5000 mg | ORAL_TABLET | Freq: Four times a day (QID) | ORAL | Status: AC | PRN

## 2016-02-24 NOTE — ED Notes (Signed)
From [facility] via EMS: Facility called EMS for N/V/D and increased lethargy. Has dementia but usually more talkative. EMS reports bradycardia at 45. Given Zofran 4mg  in route, NS infusing on arrival. Alert, oriented to self and place. Denies pain.

## 2016-02-24 NOTE — ED Notes (Signed)
Patient attends changed - incont of urine

## 2016-02-24 NOTE — ED Notes (Signed)
Ginger ale given for PO challenge.

## 2016-02-24 NOTE — ED Notes (Signed)
[facility] here to transport  

## 2016-02-24 NOTE — ED Notes (Signed)
[facility] 

## 2016-02-24 NOTE — ED Provider Notes (Signed)
CSN: [id number]     Arrival date & time 02/24/16  1156 History   First MD Initiated Contact with Patient 02/24/16 1205     Chief Complaint  Patient presents with  . Bradycardia  . Emesis     (Consider location/radiation/quality/duration/timing/severity/associated sxs/prior Treatment) HPI Patient does have history of dementia and is on a memory care unit. Her husband was with her yesterday at mealtime. He reports she ate and did not seem ill. This morning, nursing home report is that the patient developed vomiting and diarrhea. They did not specify how many episodes of each. Report is that patient is usually more interactive and animated. They reports she seemed lethargic and less responsive. No reported or documented fever. Patient reports she doesn't remember what happened or if she had any symptoms. She does not endorse any localizing pain. Past Medical History  Diagnosis Date  . Dementia   . Hypertension   . GERD (gastroesophageal reflux disease)   . Thyroid disease   . Bell's palsy    Past Surgical History  Procedure Laterality Date  . Cholecystectomy N/A 01/03/2013    Procedure: LAPAROSCOPIC CHOLECYSTECTOMY WITH INTRAOPERATIVE CHOLANGIOGRAM;  Surgeon: Liz MaladyBurke E Thompson, MD;  [facility];  Service: General;  Laterality: N/A;   History reviewed. No pertinent family history. Social History  Substance Use Topics  . Smoking status: Never Smoker   . Smokeless tobacco: None  . Alcohol Use: No   OB History    No data available     Review of Systems  Cannot obtain review of systems due to dementia level V caveat. Review of systems and history obtained per the patient's husband who is present.  Allergies  Demerol and Sulfa antibiotics  Home Medications   Prior to Admission medications   Medication Sig Start Date End Date Taking? Authorizing Provider  acetaminophen (TYLENOL) 500 MG tablet Take 500 mg by mouth 3 (three) times daily.    Yes Historical Provider, MD  atenolol  (TENORMIN) 25 MG tablet Take 12.5 mg by mouth daily.   Yes Historical Provider, MD  Cranberry 400 MG TABS Take 1 tablet by mouth daily.   Yes Historical Provider, MD  divalproex (DEPAKOTE SPRINKLE) 125 MG capsule Take 1 capsule by mouth 3 (three) times daily. 01/28/16  Yes Historical Provider, MD  donepezil (ARICEPT) 10 MG tablet Take 10 mg by mouth daily.    Yes Historical Provider, MD  estrogens, conjugated, (PREMARIN) 0.625 MG tablet Take 0.625 mg by mouth daily.   Yes Historical Provider, MD  famotidine (PEPCID) 20 MG tablet Take 20 mg by mouth daily.    Yes Historical Provider, MD  loratadine (CLARITIN) 10 MG tablet Take 10 mg by mouth daily.   Yes Historical Provider, MD  memantine (NAMENDA) 10 MG tablet Take 10 mg by mouth 2 (two) times daily.   Yes Historical Provider, MD  PATADAY 0.2 % SOLN Place 1 drop into both eyes daily. For 4 weeks then D/C 02/08/16  Yes Historical Provider, MD  PRESCRIPTION MEDICATION Apply 1 application topically every 8 (eight) hours as needed (FOR AGITATION). LORAZEPAM CREAM 1 MG/ML   Yes Historical Provider, MD  QUEtiapine (SEROQUEL) 50 MG tablet Take 50 mg by mouth 3 (three) times daily.    Yes Historical Provider, MD  Vilazodone HCl (VIIBRYD) 40 MG TABS Take 40 mg by mouth daily.   Yes Historical Provider, MD  zolpidem (AMBIEN) 10 MG tablet Take 10 mg by mouth at bedtime.    Yes Historical Provider, MD  carbamide peroxide (DEBROX) 6.5 % otic solution Place 5-10 drops into both ears 2 (two) times daily as needed.     Historical Provider, MD  haloperidol (HALDOL) 1 MG tablet Take 1 mg by mouth 2 (two) times daily.    Historical Provider, MD  promethazine (PHENERGAN) 25 MG tablet Take 0.5 tablets (12.5 mg total) by mouth every 6 (six) hours as needed for nausea or vomiting. 02/24/16   Arby BarretteMarcy Mitsugi Schrader, MD   BP 127/51 mmHg  Pulse 49  Temp(Src) 97.1 F (36.2 C) (Rectal)  Resp 16  SpO2 92% Physical Exam  Constitutional:  Patient is slightly pale in appearance. She  appears fatigued. No respiratory distress.  HENT:  Head: Normocephalic and atraumatic.  Mouth/Throat: Oropharynx is clear and moist.  Eyes: EOM are normal. Pupils are equal, round, and reactive to light.  Neck: Neck supple.  Cardiovascular:  Bradycardia, 2/6 systolic ejection murmur.  Pulmonary/Chest: Effort normal and breath sounds normal. No respiratory distress.  Abdominal: Soft. Bowel sounds are normal. She exhibits no distension. There is no tenderness.  Musculoskeletal:  2+ pitting edema bilateral lower extremities. No wounds, erythema. Calves are soft.  Neurological:  Patient is awake and responsive simple questions but predominantly reports she does not know or can't remember. She will assist in repositioning of the stretcher and can follow simple commands.  Skin: Skin is warm and dry.  Psychiatric:  Affect is flat.    ED Course  Procedures (including critical care time) Labs Review Labs Reviewed  COMPREHENSIVE METABOLIC PANEL - Abnormal; Notable for the following:    Glucose, Bld 113 (*)    Albumin 3.3 (*)    ALT 11 (*)    GFR calc non Af Amer 55 (*)    All other components within normal limits  URINALYSIS, ROUTINE W REFLEX MICROSCOPIC (NOT AT [facility]) - Abnormal; Notable for the following:    APPearance CLOUDY (*)    Ketones, ur 15 (*)    All other components within normal limits  LIPASE, BLOOD  CBC  TROPONIN I    Imaging Review No results found. I have personally reviewed and evaluated these images and lab results as part of my medical decision-making.   EKG Interpretation   Date/Time:  Sunday February 24 2016 12:06:29 EDT Ventricular Rate:  49 PR Interval:    QRS Duration: 104 QT Interval:  543 QTC Calculation: 491 R Axis:   -28 Text Interpretation:  Sinus bradycardia Low voltage, precordial leads  Abnormal R-wave progression, early transition Left ventricular hypertrophy  Borderline T abnormalities, inferior leads Borderline prolonged QT  interval no  change from old Confirmed by Donnald GarrePfeiffer, MD, Lebron ConnersMarcy [phone]([id number]) on  02/24/2016 1:49:38 PM      MDM   Final diagnoses:  Vomiting and diarrhea   Patient had acute onset of both vomiting and diarrhea this morning. She has no abdominal pain. Abdomen is soft palpation. Diagnostic studies are within normal limits. Patient's vital signs are stable. She has not vomited since her arrival to the emergency department. We have trialed sips of fluids which the patient has tolerated. At this time I feel patient can return to nursing home care with supportive care. Phenergan 12.5 mg when necessary nausea prescribed.    Arby BarretteMarcy Adine Heimann, MD 02/24/16 [phone]

## 2016-02-24 NOTE — Discharge Instructions (Signed)
Suspected Viral Gastroenteritis °Viral gastroenteritis is also known as stomach flu. This condition affects the stomach and intestinal tract. It can cause sudden diarrhea and vomiting. The illness typically lasts 3 to 8 days. Most people develop an immune response that eventually gets rid of the virus. While this natural response develops, the virus can make you quite ill. °CAUSES  °Many different viruses can cause gastroenteritis, such as rotavirus or noroviruses. You can catch one of these viruses by consuming contaminated food or water. You may also catch a virus by sharing utensils or other personal items with an infected person or by touching a contaminated surface. °SYMPTOMS  °The most common symptoms are diarrhea and vomiting. These problems can cause a severe loss of body fluids (dehydration) and a body salt (electrolyte) imbalance. Other symptoms may include: °· Fever. °· Headache. °· Fatigue. °· Abdominal pain. °DIAGNOSIS  °Your caregiver can usually diagnose viral gastroenteritis based on your symptoms and a physical exam. A stool sample may also be taken to test for the presence of viruses or other infections. °TREATMENT  °This illness typically goes away on its own. Treatments are aimed at rehydration. The most serious cases of viral gastroenteritis involve vomiting so severely that you are not able to keep fluids down. In these cases, fluids must be given through an intravenous line (IV). °HOME CARE INSTRUCTIONS  °· Drink enough fluids to keep your urine clear or pale yellow. Drink small amounts of fluids frequently and increase the amounts as tolerated. °· Ask your caregiver for specific rehydration instructions. °· Avoid: °¨ Foods high in sugar. °¨ Alcohol. °¨ Carbonated drinks. °¨ Tobacco. °¨ Juice. °¨ Caffeine drinks. °¨ Extremely hot or cold fluids. °¨ Fatty, greasy foods. °¨ Too much intake of anything at one time. °¨ Dairy products until 24 to 48 hours after diarrhea stops. °· You may consume  probiotics. Probiotics are active cultures of beneficial bacteria. They may lessen the amount and number of diarrheal stools in adults. Probiotics can be found in yogurt with active cultures and in supplements. °· Wash your hands well to avoid spreading the virus. °· Only take over-the-counter or prescription medicines for pain, discomfort, or fever as directed by your caregiver. Do not give aspirin to children. Antidiarrheal medicines are not recommended. °· Ask your caregiver if you should continue to take your regular prescribed and over-the-counter medicines. °· Keep all follow-up appointments as directed by your caregiver. °SEEK IMMEDIATE MEDICAL CARE IF:  °· You are unable to keep fluids down. °· You do not urinate at least once every 6 to 8 hours. °· You develop shortness of breath. °· You notice blood in your stool or vomit. This may look like coffee grounds. °· You have abdominal pain that increases or is concentrated in one small area (localized). °· You have persistent vomiting or diarrhea. °· You have a fever. °· The patient is a child younger than 3 months, and he or she has a fever. °· The patient is a child older than 3 months, and he or she has a fever and persistent symptoms. °· The patient is a child older than 3 months, and he or she has a fever and symptoms suddenly get worse. °· The patient is a baby, and he or she has no tears when crying. °MAKE SURE YOU:  °· Understand these instructions. °· Will watch your condition. °· Will get help right away if you are not doing well or get worse. °  °This information is not intended to   replace advice given to you by your health care provider. Make sure you discuss any questions you have with your health care provider. °  °Document Released: 08/11/2005 Document Revised: 11/03/2011 Document Reviewed: 05/28/2011 °Elsevier Interactive Patient Education ©2016 [employer] ° °

## 2016-02-24 NOTE — ED Notes (Signed)
Report called to [facility].

## 2016-05-15 ENCOUNTER — Inpatient Hospital Stay (HOSPITAL_COMMUNITY)
Admission: EM | Admit: 2016-05-15 | Discharge: 2016-05-20 | DRG: 871 | Disposition: A | Discharge status: Nursing Home (Includes ICF) | Payer: Medicare Other | Attending: Internal Medicine | Admitting: Internal Medicine

## 2016-05-15 ENCOUNTER — Encounter (HOSPITAL_COMMUNITY): Payer: Self-pay | Admitting: Emergency Medicine

## 2016-05-15 DIAGNOSIS — R0682 Tachypnea, not elsewhere classified: Secondary | ICD-10-CM | POA: Diagnosis present

## 2016-05-15 DIAGNOSIS — Z885 Allergy status to narcotic agent status: Secondary | ICD-10-CM

## 2016-05-15 DIAGNOSIS — Z882 Allergy status to sulfonamides status: Secondary | ICD-10-CM

## 2016-05-15 DIAGNOSIS — F0391 Unspecified dementia with behavioral disturbance: Secondary | ICD-10-CM | POA: Diagnosis present

## 2016-05-15 DIAGNOSIS — R4182 Altered mental status, unspecified: Secondary | ICD-10-CM | POA: Diagnosis present

## 2016-05-15 DIAGNOSIS — Z66 Do not resuscitate: Secondary | ICD-10-CM | POA: Diagnosis present

## 2016-05-15 DIAGNOSIS — I10 Essential (primary) hypertension: Secondary | ICD-10-CM | POA: Diagnosis present

## 2016-05-15 DIAGNOSIS — Z993 Dependence on wheelchair: Secondary | ICD-10-CM

## 2016-05-15 DIAGNOSIS — A419 Sepsis, unspecified organism: Secondary | ICD-10-CM | POA: Diagnosis not present

## 2016-05-15 DIAGNOSIS — J189 Pneumonia, unspecified organism: Secondary | ICD-10-CM

## 2016-05-15 DIAGNOSIS — G510 Bell's palsy: Secondary | ICD-10-CM | POA: Diagnosis present

## 2016-05-15 DIAGNOSIS — F32A Depression, unspecified: Secondary | ICD-10-CM | POA: Diagnosis present

## 2016-05-15 DIAGNOSIS — F329 Major depressive disorder, single episode, unspecified: Secondary | ICD-10-CM | POA: Diagnosis present

## 2016-05-15 DIAGNOSIS — J690 Pneumonitis due to inhalation of food and vomit: Secondary | ICD-10-CM | POA: Diagnosis present

## 2016-05-15 DIAGNOSIS — K219 Gastro-esophageal reflux disease without esophagitis: Secondary | ICD-10-CM | POA: Diagnosis present

## 2016-05-15 DIAGNOSIS — Z79899 Other long term (current) drug therapy: Secondary | ICD-10-CM

## 2016-05-15 DIAGNOSIS — E876 Hypokalemia: Secondary | ICD-10-CM | POA: Diagnosis present

## 2016-05-15 DIAGNOSIS — R0602 Shortness of breath: Secondary | ICD-10-CM | POA: Diagnosis not present

## 2016-05-15 DIAGNOSIS — F03918 Unspecified dementia, unspecified severity, with other behavioral disturbance: Secondary | ICD-10-CM | POA: Diagnosis present

## 2016-05-15 DIAGNOSIS — Y95 Nosocomial condition: Secondary | ICD-10-CM | POA: Diagnosis present

## 2016-05-15 HISTORY — DX: Major depressive disorder, single episode, unspecified: F32.9

## 2016-05-15 HISTORY — DX: Depression, unspecified: F32.A

## 2016-05-15 NOTE — ED Triage Notes (Signed)
Patient here from [facility] with complaints of altered mental status, possible UTI. Lethargic patient on several sleeping medications. Fever. CBG 98.

## 2016-05-15 NOTE — ED Notes (Signed)
Bed: [id number] Expected date:  Expected time:  Means of arrival:  Comments: Ems, uti

## 2016-05-16 ENCOUNTER — Encounter (HOSPITAL_COMMUNITY): Payer: Self-pay | Admitting: Internal Medicine

## 2016-05-16 ENCOUNTER — Emergency Department (HOSPITAL_COMMUNITY): Payer: Medicare Other

## 2016-05-16 DIAGNOSIS — Z885 Allergy status to narcotic agent status: Secondary | ICD-10-CM | POA: Diagnosis not present

## 2016-05-16 DIAGNOSIS — F0391 Unspecified dementia with behavioral disturbance: Secondary | ICD-10-CM | POA: Diagnosis not present

## 2016-05-16 DIAGNOSIS — A419 Sepsis, unspecified organism: Principal | ICD-10-CM

## 2016-05-16 DIAGNOSIS — K219 Gastro-esophageal reflux disease without esophagitis: Secondary | ICD-10-CM | POA: Diagnosis present

## 2016-05-16 DIAGNOSIS — Z66 Do not resuscitate: Secondary | ICD-10-CM | POA: Diagnosis present

## 2016-05-16 DIAGNOSIS — F329 Major depressive disorder, single episode, unspecified: Secondary | ICD-10-CM

## 2016-05-16 DIAGNOSIS — J189 Pneumonia, unspecified organism: Secondary | ICD-10-CM | POA: Diagnosis present

## 2016-05-16 DIAGNOSIS — E876 Hypokalemia: Secondary | ICD-10-CM | POA: Diagnosis present

## 2016-05-16 DIAGNOSIS — R4182 Altered mental status, unspecified: Secondary | ICD-10-CM | POA: Diagnosis present

## 2016-05-16 DIAGNOSIS — F32A Depression, unspecified: Secondary | ICD-10-CM | POA: Diagnosis present

## 2016-05-16 DIAGNOSIS — I10 Essential (primary) hypertension: Secondary | ICD-10-CM | POA: Insufficient documentation

## 2016-05-16 DIAGNOSIS — G9340 Encephalopathy, unspecified: Secondary | ICD-10-CM | POA: Insufficient documentation

## 2016-05-16 DIAGNOSIS — J690 Pneumonitis due to inhalation of food and vomit: Secondary | ICD-10-CM | POA: Diagnosis present

## 2016-05-16 DIAGNOSIS — G510 Bell's palsy: Secondary | ICD-10-CM | POA: Diagnosis present

## 2016-05-16 DIAGNOSIS — Z993 Dependence on wheelchair: Secondary | ICD-10-CM | POA: Diagnosis not present

## 2016-05-16 DIAGNOSIS — Z79899 Other long term (current) drug therapy: Secondary | ICD-10-CM | POA: Diagnosis not present

## 2016-05-16 DIAGNOSIS — R0602 Shortness of breath: Secondary | ICD-10-CM | POA: Diagnosis present

## 2016-05-16 DIAGNOSIS — Z882 Allergy status to sulfonamides status: Secondary | ICD-10-CM | POA: Diagnosis not present

## 2016-05-16 DIAGNOSIS — R0682 Tachypnea, not elsewhere classified: Secondary | ICD-10-CM | POA: Diagnosis present

## 2016-05-16 DIAGNOSIS — Y95 Nosocomial condition: Secondary | ICD-10-CM | POA: Diagnosis present

## 2016-05-16 LAB — CBC WITH DIFFERENTIAL/PLATELET
BASOS ABS: 0 10*3/uL (ref 0.0–0.1)
BASOS PCT: 0 %
EOS PCT: 1 %
Eosinophils Absolute: 0 10*3/uL (ref 0.0–0.7)
HCT: 39.8 % (ref 36.0–46.0)
Hemoglobin: 13.8 g/dL (ref 12.0–15.0)
LYMPHS ABS: 0.3 10*3/uL — AB (ref 0.7–4.0)
LYMPHS PCT: 6 %
MCH: 30.7 pg (ref 26.0–34.0)
MCHC: 34.7 g/dL (ref 30.0–36.0)
MCV: 88.6 fL (ref 78.0–100.0)
MONO ABS: 0.2 10*3/uL (ref 0.1–1.0)
Monocytes Relative: 6 %
NEUTROS ABS: 3.7 10*3/uL (ref 1.7–7.7)
NEUTROS PCT: 87 %
PLATELETS: UNDETERMINED 10*3/uL (ref 150–400)
RBC: 4.49 MIL/uL (ref 3.87–5.11)
RDW: 14 % (ref 11.5–15.5)
WBC: 4.3 10*3/uL (ref 4.0–10.5)

## 2016-05-16 LAB — LACTIC ACID, PLASMA
LACTIC ACID, VENOUS: 2.5 mmol/L — AB (ref 0.5–1.9)
Lactic Acid, Venous: 3.1 mmol/L (ref 0.5–1.9)
Lactic Acid, Venous: 3.3 mmol/L (ref 0.5–1.9)

## 2016-05-16 LAB — URINALYSIS, ROUTINE W REFLEX MICROSCOPIC
BILIRUBIN URINE: NEGATIVE
GLUCOSE, UA: NEGATIVE mg/dL
HGB URINE DIPSTICK: NEGATIVE
Ketones, ur: 15 mg/dL — AB
Leukocytes, UA: NEGATIVE
Nitrite: NEGATIVE
PH: 5.5 (ref 5.0–8.0)
Protein, ur: 30 mg/dL — AB
SPECIFIC GRAVITY, URINE: 1.027 (ref 1.005–1.030)

## 2016-05-16 LAB — URINE MICROSCOPIC-ADD ON

## 2016-05-16 LAB — BASIC METABOLIC PANEL
Anion gap: 7 (ref 5–15)
BUN: 15 mg/dL (ref 6–20)
CO2: 26 mmol/L (ref 22–32)
Calcium: 7.8 mg/dL — ABNORMAL LOW (ref 8.9–10.3)
Chloride: 108 mmol/L (ref 101–111)
Creatinine, Ser: 0.79 mg/dL (ref 0.44–1.00)
GFR calc Af Amer: >60 mL/min (ref >60)
GLUCOSE: 107 mg/dL — AB (ref 65–99)
Potassium: 3.3 mmol/L — ABNORMAL LOW (ref 3.5–5.1)
Sodium: 141 mmol/L (ref 135–145)

## 2016-05-16 LAB — COMPREHENSIVE METABOLIC PANEL
ALBUMIN: 3.7 g/dL (ref 3.5–5.0)
ALK PHOS: 64 U/L (ref 38–126)
ALT: 11 U/L — ABNORMAL LOW (ref 14–54)
ANION GAP: 11 (ref 5–15)
AST: 16 U/L (ref 15–41)
BUN: 18 mg/dL (ref 6–20)
CO2: 27 mmol/L (ref 22–32)
Calcium: 9.1 mg/dL (ref 8.9–10.3)
Chloride: 102 mmol/L (ref 101–111)
Creatinine, Ser: 0.83 mg/dL (ref 0.44–1.00)
GFR calc Af Amer: >60 mL/min (ref >60)
GFR calc non Af Amer: >60 mL/min (ref >60)
GLUCOSE: 106 mg/dL — AB (ref 65–99)
POTASSIUM: 3.4 mmol/L — AB (ref 3.5–5.1)
SODIUM: 140 mmol/L (ref 135–145)
Total Bilirubin: 0.5 mg/dL (ref 0.3–1.2)
Total Protein: 6.9 g/dL (ref 6.5–8.1)

## 2016-05-16 LAB — RESPIRATORY PANEL BY PCR
Adenovirus: NOT DETECTED
BORDETELLA PERTUSSIS-RVPCR: NOT DETECTED
CHLAMYDOPHILA PNEUMONIAE-RVPPCR: NOT DETECTED
CORONAVIRUS 229E-RVPPCR: NOT DETECTED
Coronavirus HKU1: NOT DETECTED
Coronavirus NL63: NOT DETECTED
Coronavirus OC43: NOT DETECTED
INFLUENZA A-RVPPCR: NOT DETECTED
INFLUENZA B-RVPPCR: NOT DETECTED
MYCOPLASMA PNEUMONIAE-RVPPCR: NOT DETECTED
Metapneumovirus: NOT DETECTED
PARAINFLUENZA VIRUS 1-RVPPCR: NOT DETECTED
PARAINFLUENZA VIRUS 4-RVPPCR: NOT DETECTED
Parainfluenza Virus 2: NOT DETECTED
Parainfluenza Virus 3: NOT DETECTED
RESPIRATORY SYNCYTIAL VIRUS-RVPPCR: NOT DETECTED
Rhinovirus / Enterovirus: NOT DETECTED

## 2016-05-16 LAB — CBC
HEMATOCRIT: 34.3 % — AB (ref 36.0–46.0)
HEMOGLOBIN: 11.1 g/dL — AB (ref 12.0–15.0)
MCH: 29.5 pg (ref 26.0–34.0)
MCHC: 32.4 g/dL (ref 30.0–36.0)
MCV: 91.2 fL (ref 78.0–100.0)
Platelets: 122 10*3/uL — ABNORMAL LOW (ref 150–400)
RBC: 3.76 MIL/uL — ABNORMAL LOW (ref 3.87–5.11)
RDW: 14.2 % (ref 11.5–15.5)
WBC: 4.7 10*3/uL (ref 4.0–10.5)

## 2016-05-16 LAB — I-STAT CG4 LACTIC ACID, ED
LACTIC ACID, VENOUS: 4.45 mmol/L — AB (ref 0.5–1.9)
Lactic Acid, Venous: 3.86 mmol/L (ref 0.5–1.9)

## 2016-05-16 LAB — PROCALCITONIN

## 2016-05-16 LAB — PROTIME-INR
INR: 1.07
Prothrombin Time: 13.9 seconds (ref 11.4–15.2)

## 2016-05-16 LAB — MRSA PCR SCREENING: MRSA by PCR: NEGATIVE

## 2016-05-16 LAB — APTT: APTT: 25 s (ref 24–36)

## 2016-05-16 LAB — STREP PNEUMONIAE URINARY ANTIGEN: STREP PNEUMO URINARY ANTIGEN: NEGATIVE

## 2016-05-16 MED ORDER — CRANBERRY 400 MG PO TABS: 1.0000 | ORAL_TABLET | Freq: Every day | ORAL | Status: DC

## 2016-05-16 MED ORDER — DM-GUAIFENESIN ER 30-600 MG PO TB12: 1.0000 | ORAL_TABLET | Freq: Two times a day (BID) | ORAL | Status: DC | PRN

## 2016-05-16 MED ORDER — KETOTIFEN FUMARATE 0.025 % OP SOLN
1.0000 [drp] | Freq: Every day | OPHTHALMIC | Status: DC
Start: 2016-05-16 — End: 2016-05-20
  Administered 2016-05-16 – 2016-05-20 (×5): 1 [drp] via OPHTHALMIC
  Filled 2016-05-16: qty 5

## 2016-05-16 MED ORDER — MEMANTINE HCL 10 MG PO TABS
10.0000 mg | ORAL_TABLET | Freq: Two times a day (BID) | ORAL | Status: DC
  Administered 2016-05-16 – 2016-05-20 (×9): 10 mg via ORAL
  Filled 2016-05-16 (×9): qty 1

## 2016-05-16 MED ORDER — SODIUM CHLORIDE 0.9 % IV BOLUS (SEPSIS)
500.0000 mL | Freq: Once | INTRAVENOUS | Status: AC
  Administered 2016-05-16: 500 mL via INTRAVENOUS

## 2016-05-16 MED ORDER — HALOPERIDOL 1 MG PO TABS
1.0000 mg | ORAL_TABLET | Freq: Two times a day (BID) | ORAL | Status: DC | PRN
  Administered 2016-05-19: 1 mg via ORAL
  Filled 2016-05-16 (×2): qty 1

## 2016-05-16 MED ORDER — SODIUM CHLORIDE 0.9 % IV SOLN
INTRAVENOUS | Status: DC
  Administered 2016-05-16 – 2016-05-18 (×4): via INTRAVENOUS

## 2016-05-16 MED ORDER — ZOLPIDEM TARTRATE 5 MG PO TABS
5.0000 mg | ORAL_TABLET | Freq: Every day | ORAL | Status: DC
  Administered 2016-05-16 – 2016-05-19 (×4): 5 mg via ORAL
  Filled 2016-05-16 (×4): qty 1

## 2016-05-16 MED ORDER — ALBUTEROL SULFATE (2.5 MG/3ML) 0.083% IN NEBU
2.5000 mg | INHALATION_SOLUTION | Freq: Four times a day (QID) | RESPIRATORY_TRACT | Status: DC | PRN
Start: 2016-05-16 — End: 2016-05-20

## 2016-05-16 MED ORDER — DONEPEZIL HCL 10 MG PO TABS
10.0000 mg | ORAL_TABLET | Freq: Every day | ORAL | Status: DC
  Administered 2016-05-16 – 2016-05-20 (×5): 10 mg via ORAL
  Filled 2016-05-16 (×2): qty 2
  Filled 2016-05-16 (×4): qty 1

## 2016-05-16 MED ORDER — PIPERACILLIN-TAZOBACTAM 3.375 G IVPB
3.3750 g | Freq: Three times a day (TID) | INTRAVENOUS | Status: DC
  Administered 2016-05-16 – 2016-05-19 (×10): 3.375 g via INTRAVENOUS
  Filled 2016-05-16 (×8): qty 50

## 2016-05-16 MED ORDER — SODIUM CHLORIDE 0.9 % IV BOLUS (SEPSIS)
1500.0000 mL | Freq: Once | INTRAVENOUS | Status: AC
  Administered 2016-05-16: 1500 mL via INTRAVENOUS

## 2016-05-16 MED ORDER — SODIUM CHLORIDE 0.9 % IV BOLUS (SEPSIS)
1000.0000 mL | Freq: Once | INTRAVENOUS | Status: AC
  Administered 2016-05-16: 1000 mL via INTRAVENOUS

## 2016-05-16 MED ORDER — ESTROGENS CONJUGATED 0.625 MG PO TABS
0.6250 mg | ORAL_TABLET | Freq: Every day | ORAL | Status: DC
  Administered 2016-05-16 – 2016-05-20 (×5): 0.625 mg via ORAL
  Filled 2016-05-16 (×5): qty 1

## 2016-05-16 MED ORDER — VANCOMYCIN HCL IN DEXTROSE 1-5 GM/200ML-% IV SOLN
1000.0000 mg | Freq: Once | INTRAVENOUS | Status: AC
  Administered 2016-05-16: 1000 mg via INTRAVENOUS
  Filled 2016-05-16: qty 200

## 2016-05-16 MED ORDER — ACETAMINOPHEN 500 MG PO TABS
500.0000 mg | ORAL_TABLET | Freq: Three times a day (TID) | ORAL | Status: DC
  Administered 2016-05-16 – 2016-05-20 (×13): 500 mg via ORAL
  Filled 2016-05-16 (×13): qty 1

## 2016-05-16 MED ORDER — VILAZODONE HCL 40 MG PO TABS
40.0000 mg | ORAL_TABLET | Freq: Every day | ORAL | Status: DC
  Administered 2016-05-16 – 2016-05-20 (×5): 40 mg via ORAL
  Filled 2016-05-16 (×5): qty 1

## 2016-05-16 MED ORDER — POTASSIUM CHLORIDE 10 MEQ/100ML IV SOLN
10.0000 meq | INTRAVENOUS | Status: AC
  Administered 2016-05-16 (×2): 10 meq via INTRAVENOUS
  Filled 2016-05-16 (×2): qty 100

## 2016-05-16 MED ORDER — ENOXAPARIN SODIUM 40 MG/0.4ML ~~LOC~~ SOLN
40.0000 mg | SUBCUTANEOUS | Status: DC
  Administered 2016-05-16 – 2016-05-20 (×5): 40 mg via SUBCUTANEOUS
  Filled 2016-05-16 (×5): qty 0.4

## 2016-05-16 MED ORDER — VANCOMYCIN HCL IN DEXTROSE 750-5 MG/150ML-% IV SOLN
750.0000 mg | Freq: Two times a day (BID) | INTRAVENOUS | Status: DC
  Administered 2016-05-16 – 2016-05-17 (×2): 750 mg via INTRAVENOUS
  Filled 2016-05-16 (×3): qty 150

## 2016-05-16 MED ORDER — PIPERACILLIN-TAZOBACTAM 3.375 G IVPB 30 MIN
3.3750 g | Freq: Once | INTRAVENOUS | Status: AC
  Administered 2016-05-16: 3.375 g via INTRAVENOUS
  Filled 2016-05-16: qty 50

## 2016-05-16 MED ORDER — QUETIAPINE FUMARATE 50 MG PO TABS
50.0000 mg | ORAL_TABLET | Freq: Three times a day (TID) | ORAL | Status: DC
  Administered 2016-05-16 – 2016-05-20 (×13): 50 mg via ORAL
  Filled 2016-05-16: qty 1
  Filled 2016-05-16 (×3): qty 2
  Filled 2016-05-16: qty 1
  Filled 2016-05-16: qty 2
  Filled 2016-05-16: qty 1
  Filled 2016-05-16: qty 2
  Filled 2016-05-16 (×2): qty 1
  Filled 2016-05-16 (×2): qty 2
  Filled 2016-05-16: qty 1
  Filled 2016-05-16: qty 2
  Filled 2016-05-16 (×3): qty 1
  Filled 2016-05-16 (×2): qty 2
  Filled 2016-05-16 (×3): qty 1
  Filled 2016-05-16: qty 2
  Filled 2016-05-16: qty 1
  Filled 2016-05-16: qty 2
  Filled 2016-05-16: qty 1
  Filled 2016-05-16: qty 2

## 2016-05-16 MED ORDER — DIVALPROEX SODIUM 250 MG PO DR TAB
250.0000 mg | DELAYED_RELEASE_TABLET | Freq: Three times a day (TID) | ORAL | Status: DC
  Administered 2016-05-16 – 2016-05-17 (×6): 250 mg via ORAL
  Filled 2016-05-16 (×7): qty 1

## 2016-05-16 MED ORDER — ACETAMINOPHEN 650 MG RE SUPP
650.0000 mg | Freq: Once | RECTAL | Status: AC
Start: 2016-05-16 — End: 2016-05-16
  Administered 2016-05-16: 650 mg via RECTAL
  Filled 2016-05-16: qty 1

## 2016-05-16 NOTE — Progress Notes (Signed)
PHARMACIST - PHYSICIAN ORDER COMMUNICATION  CONCERNING: P&T Medication Policy on Herbal Medications  DESCRIPTION:  This patient's order for:  cranberry  has been noted.  This product(s) is classified as an "herbal" or natural product. Due to a lack of definitive safety studies or FDA approval, nonstandard manufacturing practices, plus the potential risk of unknown drug-drug interactions while on inpatient medications, the Pharmacy and Therapeutics Committee does not permit the use of "herbal" or natural products of this type within [facility].   ACTION TAKEN: The pharmacy department is unable to verify this order at this time and your patient has been informed of this safety policy. Please reevaluate patient's clinical condition at discharge and address if the herbal or natural product(s) should be resumed at that time.   Thanks Lorenza EvangelistGreen, Demara R 05/16/2016 4:14 AM

## 2016-05-16 NOTE — ED Provider Notes (Signed)
[facility]-EMERGENCY DEPT Provider Note   CSN: [id number] Arrival date & time: 05/15/16  2340  By signing my name below, I, Pamela Cummings, attest that this documentation has been prepared under the direction and in the presence of Pamela Favor, NP. Electronically Signed: Alyssa Cummings, ED Scribe. 05/16/16. 12:09 AM.  History   Chief Complaint Chief Complaint  Patient presents with  . Urinary Tract Infection  . Altered Mental Status   The history is provided by the patient. No language interpreter was used.   LEVEL 5 CAVEAT: HPI and ROS limited due to Dementia  HPI Comments: Pamela Cummings is a 82 y.o. female with PMHx of Dementia, GERD, HTN, and Bell's Palsy who presents to the Emergency Department with possible UTI per triage note. Nurses are going to contact facility to assertain baseline mental status and abilities.   Past Medical History:  Diagnosis Date  . Bell's palsy   . Dementia   . GERD (gastroesophageal reflux disease)   . Hypertension   . Thyroid disease     Patient Active Problem List   Diagnosis Date Noted  . Sepsis (HCC) 05/16/2016  . HCAP (healthcare-associated pneumonia) 05/16/2016  . Acute encephalopathy 05/16/2016  . Depression 05/16/2016  . Hypertension   . GERD (gastroesophageal reflux disease)   . Dementia with behavioral disturbance 02/27/2015  . Dementia     Past Surgical History:  Procedure Laterality Date  . CHOLECYSTECTOMY N/A 01/03/2013   Procedure: LAPAROSCOPIC CHOLECYSTECTOMY WITH INTRAOPERATIVE CHOLANGIOGRAM;  Surgeon: Pamela Malady, MD;  Location: [facility] OR;  Service: General;  Laterality: N/A;    OB History    No data available       Home Medications    Prior to Admission medications   Medication Sig Start Date End Date Taking? Authorizing Provider  acetaminophen (TYLENOL) 500 MG tablet Take 500 mg by mouth 3 (three) times daily.    Yes Historical Provider, MD  Cranberry 400 MG TABS Take 1 tablet by mouth daily.   Yes Historical  Provider, MD  divalproex (DEPAKOTE) 250 MG DR tablet Take 250 mg by mouth 3 (three) times daily.   Yes Historical Provider, MD  donepezil (ARICEPT) 10 MG tablet Take 10 mg by mouth daily.    Yes Historical Provider, MD  estrogens, conjugated, (PREMARIN) 0.625 MG tablet Take 0.625 mg by mouth daily.   Yes Historical Provider, MD  haloperidol (HALDOL) 1 MG tablet Take 1 mg by mouth 2 (two) times daily as needed for agitation.    Yes Historical Provider, MD  ketotifen (ZADITOR) 0.025 % ophthalmic solution Place 1 drop into both eyes daily.   Yes Historical Provider, MD  memantine (NAMENDA) 10 MG tablet Take 10 mg by mouth 2 (two) times daily.   Yes Historical Provider, MD  PRESCRIPTION MEDICATION Apply 1 application topically every 8 (eight) hours as needed (FOR AGITATION). LORAZEPAM CREAM 1 MG/ML   Yes Historical Provider, MD  QUEtiapine (SEROQUEL) 50 MG tablet Take 50 mg by mouth 3 (three) times daily.    Yes Historical Provider, MD  Vilazodone HCl (VIIBRYD) 40 MG TABS Take 40 mg by mouth daily.   Yes Historical Provider, MD  zolpidem (AMBIEN) 10 MG tablet Take 10 mg by mouth at bedtime.    Yes Historical Provider, MD  promethazine (PHENERGAN) 25 MG tablet Take 0.5 tablets (12.5 mg total) by mouth every 6 (six) hours as needed for nausea or vomiting. Patient not taking: Reported on 05/15/2016 02/24/16   [name], MD    Family  History No family history on file.  Social History Social History  Substance Use Topics  . Smoking status: Never Smoker  . Smokeless tobacco: Never Used  . Alcohol use No     Allergies   Demerol [meperidine] and Sulfa antibiotics   Review of Systems Review of Systems  Unable to perform ROS: Dementia   Physical Exam Updated Vital Signs BP 143/64   Pulse 78   Temp 102.6 F (39.2 C) (Rectal)   Resp 22   Ht 5\' 7"  (1.702 m)   Wt 81.6 kg   SpO2 94%   BMI 28.19 kg/m   Physical Exam  Constitutional: She appears well-developed and well-nourished. She is  active. No distress.  HENT:  Head: Normocephalic and atraumatic.  Eyes: Conjunctivae are normal. Pupils are equal, round, and reactive to light.  Slowly responsive pupils equal bilaterally   Cardiovascular: Normal rate.   Pulmonary/Chest: Effort normal. No respiratory distress.  Abdominal: Bowel sounds are normal.  Skin: Skin is warm and dry. There is pallor.  Nursing note and vitals reviewed.  ED Treatments / Results  DIAGNOSTIC STUDIES: Oxygen Saturation is 98% on [place] @ 2 L/m, normal by my interpretation.    COORDINATION OF CARE: 12:06 AM Treatment plan includes Sepsis protocol work up.  Labs (all labs ordered are listed, but only abnormal results are displayed) Labs Reviewed  COMPREHENSIVE METABOLIC PANEL - Abnormal; Notable for the following:       Result Value   Potassium 3.4 (*)    Glucose, Bld 106 (*)    ALT 11 (*)    All other components within normal limits  URINALYSIS, ROUTINE W REFLEX MICROSCOPIC (NOT AT [facility]) - Abnormal; Notable for the following:    Color, Urine AMBER (*)    Ketones, ur 15 (*)    Protein, ur 30 (*)    All other components within normal limits  CBC WITH DIFFERENTIAL/PLATELET - Abnormal; Notable for the following:    Lymphs Abs 0.3 (*)    All other components within normal limits  URINE MICROSCOPIC-ADD ON - Abnormal; Notable for the following:    Squamous Epithelial / LPF 0-5 (*)    Bacteria, UA RARE (*)    All other components within normal limits  I-STAT CG4 LACTIC ACID, ED - Abnormal; Notable for the following:    Lactic Acid, Venous 3.86 (*)    All other components within normal limits  I-STAT CG4 LACTIC ACID, ED - Abnormal; Notable for the following:    Lactic Acid, Venous 4.45 (*)    All other components within normal limits  CULTURE, BLOOD (ROUTINE X 2)  CULTURE, BLOOD (ROUTINE X 2)  URINE CULTURE  CBC WITH DIFFERENTIAL/PLATELET    EKG  EKG Interpretation None       Radiology Dg Chest 2 View  Result Date:  05/16/2016 CLINICAL DATA:  Initial evaluation for acute sepsis. EXAM: CHEST  2 VIEW COMPARISON:  Prior radiograph from 09/30/2015. FINDINGS: Patient is rotated to the right. Cardiac and mediastinal silhouettes are stable in size and contour, and remain within normal limits. Lungs are hypoinflated. Patchy bibasilar and perihilar opacities, which may reflect atelectasis and/ or infiltrates. Additionally, asymmetric opacity within the right upper lobe, which may reflect additional infiltrate. No pulmonary edema or pleural effusion. No pneumothorax. No acute osseus abnormality. Multilevel degenerative spondylolysis noted within the visualized spine. IMPRESSION: 1. Shallow lung inflation with patchy perihilar and bibasilar airspace opacities. While these findings may in part reflect atelectasis, possible infiltrates should be considered given  the provided history of sepsis. 2. Additional asymmetric hazy opacity within the right upper lobe, also concerning for possible infection. Electronically Signed   By: Rise Mu M.D.   On: 05/16/2016 02:57    Procedures .Critical Care Performed by: Pamela Cummings Authorized by: Pamela Cummings   Critical care provider statement:    Critical care time (minutes):  60   Critical care start time:  05/16/2016 12:58 AM   Critical care end time:  05/16/2016 3:58 AM   Critical care was necessary to treat or prevent imminent or life-threatening deterioration of the following conditions:  Dehydration and metabolic crisis   Critical care was time spent personally by me on the following activities:  Development of treatment plan with patient or surrogate, re-evaluation of patient's condition, ordering and review of radiographic studies, pulse oximetry and ordering and review of laboratory studies   I assumed direction of critical care for this patient from another provider in my specialty: no     (including critical care time)  Medications Ordered in ED Medications   divalproex (DEPAKOTE) DR tablet 250 mg (not administered)  ketotifen (ZADITOR) 0.025 % ophthalmic solution 1 drop (not administered)  Cranberry TABS 400 mg (not administered)  haloperidol (HALDOL) tablet 1 mg (not administered)  QUEtiapine (SEROQUEL) tablet 50 mg (not administered)  acetaminophen (TYLENOL) tablet 500 mg (not administered)  donepezil (ARICEPT) tablet 10 mg (not administered)  estrogens (conjugated) (PREMARIN) tablet 0.625 mg (not administered)  memantine (NAMENDA) tablet 10 mg (not administered)  Vilazodone HCl (VIIBRYD) TABS 40 mg (not administered)  zolpidem (AMBIEN) tablet 10 mg (not administered)  potassium chloride 10 mEq in 100 mL IVPB (not administered)  0.9 %  sodium chloride infusion (not administered)  acetaminophen (TYLENOL) suppository 650 mg (650 mg Rectal Given 05/16/16 0021)  sodium chloride 0.9 % bolus 1,000 mL (0 mLs Intravenous Stopped 05/16/16 0312)  piperacillin-tazobactam (ZOSYN) IVPB 3.375 g (0 g Intravenous Stopped 05/16/16 0312)  vancomycin (VANCOCIN) IVPB 1000 mg/200 mL premix (0 mg Intravenous Stopped 05/16/16 0312)  sodium chloride 0.9 % bolus 1,500 mL (1,500 mLs Intravenous New Bag/Given 05/16/16 0354)     Initial Impression / Assessment and Plan / ED Course  I have reviewed the triage vital signs and the nursing notes.  Pertinent labs & imaging results that were available during my care of the patient were reviewed by me and considered in my medical decision making (see chart for details).  Clinical Course  I personally performed the services described in this documentation, which was scribed in my presence. The recorded information has been reviewed and is accurate.      Final Clinical Impressions(s) / ED Diagnoses   Final diagnoses:  Essential hypertension  Gastroesophageal reflux disease without esophagitis  Sepsis, due to unspecified organism (HCC)  HAP (hospital-acquired pneumonia)    New Prescriptions New Prescriptions   No  medications on file     Pamela Favor, NP 05/16/16 0359    Pamela Favor, NP 05/16/16 1610    Pricilla Loveless, MD 05/21/16 1312

## 2016-05-16 NOTE — Progress Notes (Signed)
Pharmacy Antibiotic Note  Marti Sleighlizabeth A Dimarzo is a 82 y.o. female admitted on 05/15/2016 with pneumonia.  Pharmacy has been consulted for vancomycin dosing.  Plan: Vancomycin 1Gm x1 then 750mg  IV q12h (VT=15-20 mg/L) Zosyn 3.375 Gm iV q8h EI  Height: 5\' 7"  (170.2 cm) Weight: 180 lb (81.6 kg) IBW/kg (Calculated) : 61.6  Temp (24hrs), Avg:102.8 F (39.3 C), Min:102.6 F (39.2 C), Max:102.9 F (39.4 C)   Recent Labs Lab 05/16/16 0023 05/16/16 0032 05/16/16 0051 05/16/16 0339  WBC  --   --  4.3  --   CREATININE 0.83  --   --   --   LATICACIDVEN  --  3.86*  --  4.45*    Estimated Creatinine Clearance: 58.4 mL/min (by C-G formula based on SCr of 0.83 mg/dL).    Allergies  Allergen Reactions  . Demerol [Meperidine]     NOTED ON MAR  . Sulfa Antibiotics     NOTED ON MAR    Antimicrobials this admission: 9/22 zosyn >>  9/22 vancomycin >>   Dose adjustments this admission:   Microbiology results:  BCx:   UCx:    Sputum:    MRSA PCR:   Thank you for allowing pharmacy to be a part of this patient's care.  Lorenza EvangelistGreen, Falan R 05/16/2016 4:06 AM

## 2016-05-16 NOTE — Evaluation (Signed)
Clinical/Bedside Swallow Evaluation Patient Details  Name: Pamela Cummings MRN: [id number] Date of Birth: 10/05/1931  Today's Date: 05/16/2016 Time: SLP Start Time (ACUTE ONLY): [id number] SLP Stop Time (ACUTE ONLY): 1037 SLP Time Calculation (min) (ACUTE ONLY): 45 min  Past Medical History:  Past Medical History:  Diagnosis Date  . Bell's palsy   . Dementia   . GERD (gastroesophageal reflux disease)   . Hypertension   . Thyroid disease    Past Surgical History:  Past Surgical History:  Procedure Laterality Date  . CHOLECYSTECTOMY N/A 01/03/2013   Procedure: LAPAROSCOPIC CHOLECYSTECTOMY WITH INTRAOPERATIVE CHOLANGIOGRAM;  Surgeon: Liz MaladyBurke E Thompson, MD;  [facility];  Service: General;  Laterality: N/A;   HPI:  82 yo [facility] with [facility], weakness and fever.  PMH + for dementia, Bell's palsy impacting left, thyroid disease, GERD.  Pt with ? RUL infection per imaging.  Swallow eval ordered.  Family present *spouse and caregiver* and deny pt having problems swallowing.  She does feed herself and requires a great deal of time to eat a meal *an hour per caregiver.     Assessment / Plan / Recommendation Clinical Impression  Pt with only minimal oral deficits likely due to her dementia/cognitive deficits.  CN exam unremarkable *of which pt would follow commands, however pt does have h/o Bell's Palsy impacting left side.  Mild oral delay noted but no significant pocketing.  No overt indication of airway compromise and pt's family deny dysphagia prior to admit. Pt does have cervical osteophytes that may negatively impact pharyngeal clearance therefore recommend medicine with puree *(crush if large).    SLP educated pt's caregiver/spouse to dysphagia mitigation strategies, findings of esophagram in 2008 and gustatory changes associated with dementia.  Encouraged them to help pt to self feed to help pt maximize airway protection.  As pt has not had pneumonias until this admit and has  maintained weight, hydration, recommend regular/thin diet with precautions.    Advised if pt has recurrent pnas or weight loss, she may benefit from further testing (MBS).  No follow up indicated as all education completed.     Aspiration Risk  Mild aspiration risk    Diet Recommendation Regular;Thin liquid   Liquid Administration via: Straw;Cup Medication Administration: Crushed with puree Supervision: Staff to assist with self feeding Compensations: Slow rate;Small sips/bites    Other  Recommendations Oral Care Recommendations: Oral care BID   Follow up Recommendations None      Frequency and Duration   n/a         Prognosis        Swallow Study   General Date of Onset: 05/16/16 HPI: 82 [facility], weakness and fever.  PMH + for dementia, Bell's palsy impacting left, thyroid disease, GERD.  Pt with ? RUL infection per imaging.  Swallow eval ordered.  Family present *spouse and caregiver* and deny pt having problems swallowing.  She does feed herself and requires a great deal of time to eat a meal *an hour per caregiver.   Type of Study: Bedside Swallow Evaluation Previous Swallow Assessment: esophagram 2008 penetration to vocal folds, anterior bone spurs C4-C5 to C5-C7 Diet Prior to this Study: Regular;Thin liquids Temperature Spikes Noted: Yes Respiratory Status: Nasal cannula Behavior/Cognition: Alert;Cooperative;Other (Comment);Doesn't follow directions (pt has dementia) Oral Cavity Assessment: Within Functional Limits Oral Care Completed by SLP: No Oral Cavity - Dentition: Adequate natural dentition Vision: Functional for self-feeding Self-Feeding Abilities: Needs assist Patient Positioning: Upright  in bed Baseline Vocal Quality: Normal;Low vocal intensity Volitional Cough: Cognitively unable to elicit Volitional Swallow: Unable to elicit    Oral/Motor/Sensory Function Overall Oral Motor/Sensory Function: Mild impairment (has h/o Bell's palsy  impacting left facial/trigeminal nerves)   Ice Chips Ice chips: Not tested   Thin Liquid Thin Liquid: Within functional limits Presentation: Straw    Nectar Thick Nectar Thick Liquid: Not tested   Honey Thick Honey Thick Liquid: Not tested   Puree Puree: Within functional limits Presentation: Self Fed;Spoon   Solid   GO   Solid: Impaired Presentation: Self Fed Oral Phase Impairments: Reduced lingual movement/coordination;Impaired mastication;Poor awareness of bolus Oral Phase Functional Implications: Prolonged oral transit;Impaired mastication Pharyngeal Phase Impairments: Cough - Delayed (delayed subtle cough x1, no further episodes)        Mills Koller, MS [facility] SLP [phone]

## 2016-05-16 NOTE — Progress Notes (Signed)
PROGRESS NOTE  Pamela Cummings ZOX:[id number] DOB: 22-Oct-1931 DOA: 05/15/2016 PCP: Minda MeoARONSON,RICHARD A, MD  Hospital Course/Subjective: 82 y.o. female with medical history significant of dementia with behavior disturbance, wheelchair-bound, depression, Bell's palsy, who presents with lethargy, fever, cough, shortness breath. She was diagnosed with HCAP as well as sepsis, treated with IV fluids, IV antibiotics. Lactate still elevated but improving, and IV fluids continue. Family including husband who is a retired MD are at the bedside, patient feels week and 'sick' but nothing specific, denies cough, chest pain or fevers.   Assessment/Plan: Sepsis possibly due to HCAP: Patient is septic with elevated lactate, fever and tachypnea. Currently hemodynamically stable. Likely due to HCAP. Aspiration pneumonia cannot be completely ruled out. - continue IV Vancomycin and Zosyn - SLP - Mucinex for cough  - prn Albuterol Nebs for SOB - Urine legionella and S. pneumococcal antigen - Follow up blood culture x2, sputum culture and respiratory virus panel - will get Procalcitonin and trend lactic acid level per sepsis protocol - IVF: continue 100 mL per hour of NS, Lactate is improving, will recheck at noon.  Dementia with behavioral disturbance: -on Depakote, donepezil, Namenda  -Haldol prn   Depression: Stable, no suicidal or homicidal ideations. -Continue home medications: Vilazodone and Seroquel  HTN: not on meds at home. bp 140/62 -monitor Bp   Hypokalemia: K= 3.4  on admission. - Repleted  DVT Prophylaxis: Lovenox  Code Status: DNR   Family Communication: Daughter and husband at bedside, updated.  Disposition Plan: Back to memory care once improved in 2-3 days.   Consultants:  None   Procedures:  None   Antimicrobials:  Vanco/Zosyn 9/22 -->    Objective: Vitals:   05/16/16 0330 05/16/16 0400 05/16/16 0430 05/16/16 0510  BP: 143/64 146/62 138/83 (!) 146/73  Pulse:  78 77  72  Resp: 22 23 20 20   Temp:    97.3 F (36.3 C)  TempSrc:    Oral  SpO2: 94% 95%  95%  Weight:      Height:       No intake or output data in the 24 hours ending 05/16/16 1055 Filed Weights   05/16/16 0014  Weight: 81.6 kg (180 lb)     Exam: General:  Alert, oriented, calm, in no acute distress, resting comfortably, on O2, tired appearing Neck: supple, no masses, trachea mildline  Cardiovascular: RRR, no murmurs or rubs, no peripheral edema  Respiratory: clear to auscultation bilaterally, no wheezes, no crackles  Abdomen: soft, nontender, nondistended, normal bowel tones heard  Skin: dry, no rashes  Musculoskeletal: no joint effusions, normal range of motion  Psychiatric: appropriate affect, normal speech  Neurologic: extraocular muscles intact, clear speech, moving all extremities with intact sensorium    Data Reviewed: CBC:  Recent Labs Lab 05/16/16 0051 05/16/16 0418  WBC 4.3 4.7  NEUTROABS 3.7  --   HGB 13.8 11.1*  HCT 39.8 34.3*  MCV 88.6 91.2  PLT PLATELET CLUMPS NOTED ON SMEAR, UNABLE TO ESTIMATE 122*   Basic Metabolic Panel:  Recent Labs Lab 05/16/16 0023 05/16/16 0418  NA 140 141  K 3.4* 3.3*  CL 102 108  CO2 27 26  GLUCOSE 106* 107*  BUN 18 15  CREATININE 0.83 0.79  CALCIUM 9.1 7.8*   GFR: Estimated Creatinine Clearance: 60.6 mL/min (by C-G formula based on SCr of 0.79 mg/dL). Liver Function Tests:  Recent Labs Lab 05/16/16 0023  AST 16  ALT 11*  ALKPHOS 64  BILITOT 0.5  PROT 6.9  ALBUMIN 3.7   No results for input(s): LIPASE, AMYLASE in the last 168 hours. No results for input(s): AMMONIA in the last 168 hours. Coagulation Profile:  Recent Labs Lab 05/16/16 0418  INR 1.07   Cardiac Enzymes: No results for input(s): CKTOTAL, CKMB, CKMBINDEX, TROPONINI in the last 168 hours. BNP (last 3 results) No results for input(s): PROBNP in the last 8760 hours. HbA1C: No results for input(s): HGBA1C in the last 72  hours. CBG: No results for input(s): GLUCAP in the last 168 hours. Lipid Profile: No results for input(s): CHOL, HDL, LDLCALC, TRIG, CHOLHDL, LDLDIRECT in the last 72 hours. Thyroid Function Tests: No results for input(s): TSH, T4TOTAL, FREET4, T3FREE, THYROIDAB in the last 72 hours. Anemia Panel: No results for input(s): VITAMINB12, FOLATE, FERRITIN, TIBC, IRON, RETICCTPCT in the last 72 hours. Urine analysis:    Component Value Date/Time   COLORURINE AMBER (A) 05/16/2016 0031   APPEARANCEUR CLEAR 05/16/2016 0031   LABSPEC 1.027 05/16/2016 0031   PHURINE 5.5 05/16/2016 0031   GLUCOSEU NEGATIVE 05/16/2016 0031   HGBUR NEGATIVE 05/16/2016 0031   BILIRUBINUR NEGATIVE 05/16/2016 0031   KETONESUR 15 (A) 05/16/2016 0031   PROTEINUR 30 (A) 05/16/2016 0031   UROBILINOGEN 0.2 05/21/2015 1256   NITRITE NEGATIVE 05/16/2016 0031   LEUKOCYTESUR NEGATIVE 05/16/2016 0031   Sepsis Labs: @LABRCNTIP (procalcitonin:4,lacticidven:4)  ) Recent Results (from the past 240 hour(s))  MRSA PCR Screening     Status: None   Collection Time: 05/16/16  5:28 AM  Result Value Ref Range Status   MRSA by PCR NEGATIVE NEGATIVE Final    Comment:        The GeneXpert MRSA Assay (FDA approved for NASAL specimens only), is one component of a comprehensive MRSA colonization surveillance program. It is not intended to diagnose MRSA infection nor to guide or monitor treatment for MRSA infections.      Studies: Dg Chest 2 View  Result Date: 05/16/2016 CLINICAL DATA:  Initial evaluation for acute sepsis. EXAM: CHEST  2 VIEW COMPARISON:  Prior radiograph from 09/30/2015. FINDINGS: Patient is rotated to the right. Cardiac and mediastinal silhouettes are stable in size and contour, and remain within normal limits. Lungs are hypoinflated. Patchy bibasilar and perihilar opacities, which may reflect atelectasis and/ or infiltrates. Additionally, asymmetric opacity within the right upper lobe, which may reflect  additional infiltrate. No pulmonary edema or pleural effusion. No pneumothorax. No acute osseus abnormality. Multilevel degenerative spondylolysis noted within the visualized spine. IMPRESSION: 1. Shallow lung inflation with patchy perihilar and bibasilar airspace opacities. While these findings may in part reflect atelectasis, possible infiltrates should be considered given the provided history of sepsis. 2. Additional asymmetric hazy opacity within the right upper lobe, also concerning for possible infection. Electronically Signed   By: Rise Mu M.D.   On: 05/16/2016 02:57    Scheduled Meds: . acetaminophen  500 mg Oral TID  . divalproex  250 mg Oral TID  . donepezil  10 mg Oral Daily  . enoxaparin (LOVENOX) injection  40 mg Subcutaneous Q24H  . estrogens (conjugated)  0.625 mg Oral Daily  . ketotifen  1 drop Both Eyes Daily  . memantine  10 mg Oral BID  . piperacillin-tazobactam (ZOSYN)  IV  3.375 g Intravenous Q8H  . QUEtiapine  50 mg Oral TID  . vancomycin  750 mg Intravenous Q12H  . Vilazodone HCl  40 mg Oral Daily  . zolpidem  5 mg Oral QHS    Continuous Infusions: . sodium chloride 100 mL/hr  at 05/16/16 0422     LOS: 0 days   Time spent: 20 minutes  Mir Vergie Living, MD [facility] Pager [phone]  If 7PM-7AM, please contact night-coverage [email or web address] Password [email or web address] 05/16/2016, 10:55 AM

## 2016-05-16 NOTE — ED Notes (Signed)
Delay In pt vitals assisting nurse in another room

## 2016-05-16 NOTE — H&P (Addendum)
History and Physical    Pamela Cummings UJW:[id number] DOB: 08-04-1932 DOA: 05/15/2016  Referring MD/NP/PA:   PCP: Minda MeoARONSON,RICHARD A, MD   Patient coming from:  The patient is coming from SNF.  At baseline, pt is dependent for her ADL.  Chief Complaint: Lethargy, fever, cough and shortness of breath  HPI: Pamela Cummings is a 82 y.o. female with medical history significant of dementia with behavior disturbance, wheelchair-bound, depression, Bell's palsy, who presents with lethargy, fever, cough, shortness breath.  Per patient's daughter, patient has fever, dry cough and shortness of breath for several days.She is more lethargic than the baseline. She does not seem to have chest pain per her daughter. She does not seem to have nausea, vomiting, abdominal pain per her daughter. She is wheelchair bound., Does not mover lower extremity much, which is normal to patient.  ED Course: pt was found to have WBC 4.3, lactate is 3.86, negative urinalysis, potassium 3.4, creatinine normal, temperature while 2.9, no tachycardia, has tachypnea, oxygen saturation 94% on room air. Chest x-ray showed patchy perihilar and bibasal obesity. Patient is admitted to telemetry bed as inpatient.  Review of Systems: Could not be reviewed due to dementia.  Allergy:  Allergies  Allergen Reactions  . Demerol [Meperidine]     NOTED ON MAR  . Sulfa Antibiotics     NOTED ON MAR    Past Medical History:  Diagnosis Date  . Bell's palsy   . Dementia   . GERD (gastroesophageal reflux disease)   . Hypertension   . Thyroid disease     Past Surgical History:  Procedure Laterality Date  . CHOLECYSTECTOMY N/A 01/03/2013   Procedure: LAPAROSCOPIC CHOLECYSTECTOMY WITH INTRAOPERATIVE CHOLANGIOGRAM;  Surgeon: Liz MaladyBurke E Thompson, MD;  Location: [facility] OR;  Service: General;  Laterality: N/A;    Social History:  reports that she has never smoked. She has never used smokeless tobacco. She reports that she does not drink  alcohol. Her drug history is not on file.  Family History: Could not be reviewed due to dementia.  Prior to Admission medications   Medication Sig Start Date End Date Taking? Authorizing Provider  acetaminophen (TYLENOL) 500 MG tablet Take 500 mg by mouth 3 (three) times daily.    Yes Historical Provider, MD  Cranberry 400 MG TABS Take 1 tablet by mouth daily.   Yes Historical Provider, MD  divalproex (DEPAKOTE) 250 MG DR tablet Take 250 mg by mouth 3 (three) times daily.   Yes Historical Provider, MD  donepezil (ARICEPT) 10 MG tablet Take 10 mg by mouth daily.    Yes Historical Provider, MD  estrogens, conjugated, (PREMARIN) 0.625 MG tablet Take 0.625 mg by mouth daily.   Yes Historical Provider, MD  haloperidol (HALDOL) 1 MG tablet Take 1 mg by mouth 2 (two) times daily as needed for agitation.    Yes Historical Provider, MD  ketotifen (ZADITOR) 0.025 % ophthalmic solution Place 1 drop into both eyes daily.   Yes Historical Provider, MD  memantine (NAMENDA) 10 MG tablet Take 10 mg by mouth 2 (two) times daily.   Yes Historical Provider, MD  PRESCRIPTION MEDICATION Apply 1 application topically every 8 (eight) hours as needed (FOR AGITATION). LORAZEPAM CREAM 1 MG/ML   Yes Historical Provider, MD  QUEtiapine (SEROQUEL) 50 MG tablet Take 50 mg by mouth 3 (three) times daily.    Yes Historical Provider, MD  Vilazodone HCl (VIIBRYD) 40 MG TABS Take 40 mg by mouth daily.   Yes Historical Provider, MD  zolpidem (AMBIEN) 10 MG tablet Take 10 mg by mouth at bedtime.    Yes Historical Provider, MD  promethazine (PHENERGAN) 25 MG tablet Take 0.5 tablets (12.5 mg total) by mouth every 6 (six) hours as needed for nausea or vomiting. Patient not taking: Reported on 05/15/2016 02/24/16   [name], MD    Physical Exam: Vitals:   05/16/16 0330 05/16/16 0400 05/16/16 0430 05/16/16 0510  BP: 143/64 146/62 138/83 (!) 146/73  Pulse: 78 77  72  Resp: 22 23 20 20   Temp:    97.3 F (36.3 C)  TempSrc:     Oral  SpO2: 94% 95%  95%  Weight:      Height:       General: Not in acute distress HEENT:       Eyes: PERRL, EOMI, no scleral icterus.       ENT: No discharge from the ears and nose, no pharynx injection, no tonsillar enlargement.        Neck: No JVD, no bruit, no mass felt. Heme: No neck lymph node enlargement. Cardiac: S1/S2, RRR, No murmurs, No gallops or rubs. Respiratory:  No rales, wheezing, rhonchi or rubs. GI: Soft, nondistended, nontender, no rebound pain, no organomegaly, BS present. GU: No hematuria Ext: No pitting leg edema bilaterally. 2+DP/PT pulse bilaterally. Musculoskeletal: No joint deformities, No joint redness or warmth, no limitation of ROM in spin. Skin: No rashes.  Neuro: Lethargic, not oriented X3, cranial nerves II-XII grossly intact. Psych: Could not be reviewed due to dementia Labs on Admission: I have personally reviewed following labs and imaging studies  CBC:  Recent Labs Lab 05/16/16 0051 05/16/16 0418  WBC 4.3 4.7  NEUTROABS 3.7  --   HGB 13.8 11.1*  HCT 39.8 34.3*  MCV 88.6 91.2  PLT PLATELET CLUMPS NOTED ON SMEAR, UNABLE TO ESTIMATE 122*   Basic Metabolic Panel:  Recent Labs Lab 05/16/16 0023 05/16/16 0418  NA 140 141  K 3.4* 3.3*  CL 102 108  CO2 27 26  GLUCOSE 106* 107*  BUN 18 15  CREATININE 0.83 0.79  CALCIUM 9.1 7.8*   GFR: Estimated Creatinine Clearance: 60.6 mL/min (by C-G formula based on SCr of 0.79 mg/dL). Liver Function Tests:  Recent Labs Lab 05/16/16 0023  AST 16  ALT 11*  ALKPHOS 64  BILITOT 0.5  PROT 6.9  ALBUMIN 3.7   No results for input(s): LIPASE, AMYLASE in the last 168 hours. No results for input(s): AMMONIA in the last 168 hours. Coagulation Profile:  Recent Labs Lab 05/16/16 0418  INR 1.07   Cardiac Enzymes: No results for input(s): CKTOTAL, CKMB, CKMBINDEX, TROPONINI in the last 168 hours. BNP (last 3 results) No results for input(s): PROBNP in the last 8760 hours. HbA1C: No  results for input(s): HGBA1C in the last 72 hours. CBG: No results for input(s): GLUCAP in the last 168 hours. Lipid Profile: No results for input(s): CHOL, HDL, LDLCALC, TRIG, CHOLHDL, LDLDIRECT in the last 72 hours. Thyroid Function Tests: No results for input(s): TSH, T4TOTAL, FREET4, T3FREE, THYROIDAB in the last 72 hours. Anemia Panel: No results for input(s): VITAMINB12, FOLATE, FERRITIN, TIBC, IRON, RETICCTPCT in the last 72 hours. Urine analysis:    Component Value Date/Time   COLORURINE AMBER (A) 05/16/2016 0031   APPEARANCEUR CLEAR 05/16/2016 0031   LABSPEC 1.027 05/16/2016 0031   PHURINE 5.5 05/16/2016 0031   GLUCOSEU NEGATIVE 05/16/2016 0031   HGBUR NEGATIVE 05/16/2016 0031   BILIRUBINUR NEGATIVE 05/16/2016 0031   KETONESUR 15 (  A) 05/16/2016 0031   PROTEINUR 30 (A) 05/16/2016 0031   UROBILINOGEN 0.2 05/21/2015 1256   NITRITE NEGATIVE 05/16/2016 0031   LEUKOCYTESUR NEGATIVE 05/16/2016 0031   Sepsis Labs: @LABRCNTIP (procalcitonin:4,lacticidven:4) ) Recent Results (from the past 240 hour(s))  MRSA PCR Screening     Status: None   Collection Time: 05/16/16  5:28 AM  Result Value Ref Range Status   MRSA by PCR NEGATIVE NEGATIVE Final    Comment:        The GeneXpert MRSA Assay (FDA approved for NASAL specimens only), is one component of a comprehensive MRSA colonization surveillance program. It is not intended to diagnose MRSA infection nor to guide or monitor treatment for MRSA infections.      Radiological Exams on Admission: Dg Chest 2 View  Result Date: 05/16/2016 CLINICAL DATA:  Initial evaluation for acute sepsis. EXAM: CHEST  2 VIEW COMPARISON:  Prior radiograph from 09/30/2015. FINDINGS: Patient is rotated to the right. Cardiac and mediastinal silhouettes are stable in size and contour, and remain within normal limits. Lungs are hypoinflated. Patchy bibasilar and perihilar opacities, which may reflect atelectasis and/ or infiltrates. Additionally,  asymmetric opacity within the right upper lobe, which may reflect additional infiltrate. No pulmonary edema or pleural effusion. No pneumothorax. No acute osseus abnormality. Multilevel degenerative spondylolysis noted within the visualized spine. IMPRESSION: 1. Shallow lung inflation with patchy perihilar and bibasilar airspace opacities. While these findings may in part reflect atelectasis, possible infiltrates should be considered given the provided history of sepsis. 2. Additional asymmetric hazy opacity within the right upper lobe, also concerning for possible infection. Electronically Signed   By: Rise Mu M.D.   On: 05/16/2016 02:57     EKG: Independently reviewed. Sinus rhythm, QTC 495, bifascicular blockage. Assessment/Plan Principal Problem:   HCAP (healthcare-associated pneumonia) Active Problems:   Dementia with behavioral disturbance   Sepsis (HCC)   Depression   Hypokalemia   Sepsis possibly due to HCAP: Patient is septic with elevated lactate, fever and tachypnea. Currently hemodynamically stable. Likely due to HCAP. Aspiration pneumonia cannot be completely ruled out.  - Will admit to bed as in pt - IV Vancomycin and Zosyn - SLP - Mucinex for cough  - prn Albuterol Nebs for SOB - Urine legionella and S. pneumococcal antigen - Follow up blood culture x2, sputum culture and respiratory virus panel - will get Procalcitonin and trend lactic acid level per sepsis protocol - IVF: 2.5L of NS bolus in ED, followed by 100 mL per hour of NS  Dementia with behavioral disturbance: -on Depakote, donepezil, Namenda  -Holdal prn   Depression: Stable, no suicidal or homicidal ideations. -Continue home medications: Vilazodone and Seroquel  HTN: not on meds at home. bp 140/62 -monitor Bp   Hypokalemia: K= 3.4  on admission. - Repleted  DVT ppx: SQ Lovenox Code Status: Full code Family Communication: es, patient's daughter at bed side Disposition Plan:  Anticipate  discharge back to previous SNF environment Consults called:  none Admission status:  Inpatient/tele   Date of Service 05/16/2016    Lorretta Harp [facility] Pager [phone]  If 7PM-7AM, please contact night-coverage [email or web address] Password [facility] 05/16/2016, 7:24 AM

## 2016-05-17 LAB — CBC
HCT: 36.8 % (ref 36.0–46.0)
Hemoglobin: 11.6 g/dL — ABNORMAL LOW (ref 12.0–15.0)
MCH: 28.4 pg (ref 26.0–34.0)
MCHC: 31.5 g/dL (ref 30.0–36.0)
MCV: 90 fL (ref 78.0–100.0)
PLATELETS: 137 10*3/uL — AB (ref 150–400)
RBC: 4.09 MIL/uL (ref 3.87–5.11)
RDW: 14.1 % (ref 11.5–15.5)
WBC: 6.1 10*3/uL (ref 4.0–10.5)

## 2016-05-17 LAB — BASIC METABOLIC PANEL
Anion gap: 8 (ref 5–15)
BUN: 8 mg/dL (ref 6–20)
CALCIUM: 8 mg/dL — AB (ref 8.9–10.3)
CHLORIDE: 108 mmol/L (ref 101–111)
CO2: 25 mmol/L (ref 22–32)
CREATININE: 0.84 mg/dL (ref 0.44–1.00)
GFR calc Af Amer: >60 mL/min (ref >60)
GFR calc non Af Amer: >60 mL/min (ref >60)
GLUCOSE: 90 mg/dL (ref 65–99)
Potassium: 3.2 mmol/L — ABNORMAL LOW (ref 3.5–5.1)
Sodium: 141 mmol/L (ref 135–145)

## 2016-05-17 LAB — BLOOD CULTURE ID PANEL (REFLEXED)
Acinetobacter baumannii: NOT DETECTED
CANDIDA GLABRATA: NOT DETECTED
CANDIDA TROPICALIS: NOT DETECTED
Candida albicans: NOT DETECTED
Candida krusei: NOT DETECTED
Candida parapsilosis: NOT DETECTED
ENTEROBACTER CLOACAE COMPLEX: NOT DETECTED
Enterobacteriaceae species: NOT DETECTED
Enterococcus species: NOT DETECTED
Escherichia coli: NOT DETECTED
HAEMOPHILUS INFLUENZAE: NOT DETECTED
KLEBSIELLA PNEUMONIAE: NOT DETECTED
Klebsiella oxytoca: NOT DETECTED
Listeria monocytogenes: NOT DETECTED
Methicillin resistance: DETECTED — AB
NEISSERIA MENINGITIDIS: NOT DETECTED
PROTEUS SPECIES: NOT DETECTED
Pseudomonas aeruginosa: NOT DETECTED
SERRATIA MARCESCENS: NOT DETECTED
STAPHYLOCOCCUS AUREUS BCID: NOT DETECTED
STAPHYLOCOCCUS SPECIES: DETECTED — AB
STREPTOCOCCUS SPECIES: DETECTED — AB
Streptococcus agalactiae: NOT DETECTED
Streptococcus pneumoniae: NOT DETECTED
Streptococcus pyogenes: NOT DETECTED

## 2016-05-17 LAB — URINE CULTURE

## 2016-05-17 LAB — VANCOMYCIN, TROUGH: VANCOMYCIN TR: 13 ug/mL — AB (ref 15–20)

## 2016-05-17 MED ORDER — POTASSIUM CHLORIDE CRYS ER 20 MEQ PO TBCR
40.0000 meq | EXTENDED_RELEASE_TABLET | Freq: Two times a day (BID) | ORAL | Status: AC
  Administered 2016-05-17 (×2): 40 meq via ORAL
  Filled 2016-05-17 (×2): qty 2

## 2016-05-17 MED ORDER — VANCOMYCIN HCL IN DEXTROSE 1-5 GM/200ML-% IV SOLN
1000.0000 mg | Freq: Two times a day (BID) | INTRAVENOUS | Status: DC
  Administered 2016-05-17 – 2016-05-20 (×6): 1000 mg via INTRAVENOUS
  Filled 2016-05-17 (×5): qty 200

## 2016-05-17 NOTE — Progress Notes (Signed)
Pharmacy Antibiotic Note  Pamela Cummings is a 82 y.o. female admitted on 05/15/2016 with sepsis.  Pharmacy has been consulted for vancomycin and Zosyn dosing.    BCID showed Staph species, Strept species, and methicillin resistance.  Continuing broad spectrum today per MD with HCAP still a concern.  Vancomycin trough 13 mcg/ml on 750 mg IV q12h.  Plan: Increase vancomycin to 1g q12h.  F/u culture results.  Recheck VT at steady state. Continue Zosyn 3.375g IV q8h (4 hour infusion time).  F/u plan for narrowing.  Height: 5\' 7"  (170.2 cm) Weight: 180 lb (81.6 kg) IBW/kg (Calculated) : 61.6  Temp (24hrs), Avg:98 F (36.7 C), Min:97.7 F (36.5 C), Max:98.2 F (36.8 C)   Recent Labs Lab 05/16/16 0023 05/16/16 0032 05/16/16 0051 05/16/16 0339 05/16/16 0418 05/16/16 0652 05/16/16 1158 05/17/16 0613 05/17/16 1040  WBC  --   --  4.3  --  4.7  --   --  6.1  --   CREATININE 0.83  --   --   --  0.79  --   --  0.84  --   LATICACIDVEN  --  3.86*  --  4.45* 3.1* 2.5* 3.3*  --   --   VANCOTROUGH  --   --   --   --   --   --   --   --  13*    Estimated Creatinine Clearance: 57.7 mL/min (by C-G formula based on SCr of 0.84 mg/dL).    Allergies  Allergen Reactions  . Demerol [Meperidine]     NOTED ON MAR  . Sulfa Antibiotics     NOTED ON MAR    Antimicrobials this admission:  9/22 zosyn >>  9/22 vancomycin >>   Dose adjustments this admission:  9/23 1100 VT: 13 mcg/ml on 750 mg q12h, increase to 1g q12h  Microbiology results:  9/22 BCx: IP, BCID = staph, strept, methicillin resistance 9/22 UCx: sent 9/22 Respiratory: neg  9/22 MRSA PCR: negative 9/22 strep pneumo ur ag: neg 9/22 legionella ur ag: IP  Thank you for allowing pharmacy to be a part of this patient's care.  Clance BollRunyon, Zakarie Sturdivant 05/17/2016 11:53 AM

## 2016-05-17 NOTE — Progress Notes (Signed)
PROGRESS NOTE  Pamela Cummings AVW:[id number] DOB: 12-08-1931 DOA: 05/15/2016 PCP: Minda Meo, MD  Hospital Course/Subjective: 82 y.o. female with medical history significant of dementia with behavior disturbance, wheelchair-bound, depression, Bell's palsy, who presents with lethargy, fever, cough, shortness breath. She was diagnosed with HCAP as well as sepsis, treated with IV fluids, IV antibiotics.   She is stable today, resting, no acute complaints. A little confused, wants me to bring her a lobster. Son is at the bedside this AM and all questions were answered.  Assessment/Plan: Sepsis possibly due to HCAP: Patient is septic with elevated lactate, fever and tachypnea. Currently hemodynamically stable. Likely due to HCAP. Aspiration pneumonia cannot be completely ruled out. She has bacteremia with positive blood culture. - repeat blood culture was drawn this AM - continue IV Vancomycin and Zosyn - SLP - Mucinex for cough  - prn Albuterol Nebs for SOB - Follow up blood culture x2, sputum culture and respiratory virus panel - IVF: continue 100 mL per hour of NS, she is hemodynamically stable  Dementia with behavioral disturbance: -on Depakote, donepezil, Namenda  -Haldol prn   Depression: Stable, no suicidal or homicidal ideations. -Continue home medications: Vilazodone and Seroquel  HTN: not on meds at home. bp 140/62 -monitor Bp   Hypokalemia: K= 3.4  on admission. - Repleted  DVT Prophylaxis: Lovenox  Code Status: DNR   Family Communication: Son at bedside, updated. He will update his father.  Disposition Plan: Back to memory care once improved in 2-3 days.   Consultants:  None   Procedures:  None   Antimicrobials:  Vanco/Zosyn 9/22 -->    Objective: Vitals:   05/16/16 0510 05/16/16 1500 05/16/16 2134 05/17/16 0708  BP: (!) 146/73 (!) 132/51 (!) 169/62 (!) 134/56  Pulse: 72 61  (!) 58  Resp: 20 20 20 20   Temp: 97.3 F (36.3 C) 98.2 F  (36.8 C) 97.7 F (36.5 C) 98 F (36.7 C)  TempSrc: Oral Oral Oral Oral  SpO2: 95% 95% 97% 98%  Weight:      Height:        Intake/Output Summary (Last 24 hours) at 05/17/16 1146 Last data filed at 05/17/16 0345  Gross per 24 hour  Intake              250 ml  Output                0 ml  Net              250 ml   Filed Weights   05/16/16 0014  Weight: 81.6 kg (180 lb)     Exam: General:  Alert, oriented, calm, in no acute distress, resting comfortably, on O2, tired appearing Neck: supple, no masses, trachea mildline  Cardiovascular: RRR, no murmurs or rubs, no peripheral edema  Respiratory: clear to auscultation bilaterally, no wheezes, no crackles  Abdomen: soft, nontender, nondistended, normal bowel tones heard  Skin: dry, no rashes  Musculoskeletal: no joint effusions, normal range of motion  Psychiatric: appropriate affect, normal speech  Neurologic: extraocular muscles intact, clear speech, moving all extremities with intact sensorium    Data Reviewed: CBC:  Recent Labs Lab 05/16/16 0051 05/16/16 0418 05/17/16 0613  WBC 4.3 4.7 6.1  NEUTROABS 3.7  --   --   HGB 13.8 11.1* 11.6*  HCT 39.8 34.3* 36.8  MCV 88.6 91.2 90.0  PLT PLATELET CLUMPS NOTED ON SMEAR, UNABLE TO ESTIMATE 122* 137*   Basic Metabolic Panel:  Recent Labs  Lab 05/16/16 0023 05/16/16 0418 05/17/16 0613  NA 140 141 141  K 3.4* 3.3* 3.2*  CL 102 108 108  CO2 27 26 25   GLUCOSE 106* 107* 90  BUN 18 15 8   CREATININE 0.83 0.79 0.84  CALCIUM 9.1 7.8* 8.0*   GFR: Estimated Creatinine Clearance: 57.7 mL/min (by C-G formula based on SCr of 0.84 mg/dL). Liver Function Tests:  Recent Labs Lab 05/16/16 0023  AST 16  ALT 11*  ALKPHOS 64  BILITOT 0.5  PROT 6.9  ALBUMIN 3.7   No results for input(s): LIPASE, AMYLASE in the last 168 hours. No results for input(s): AMMONIA in the last 168 hours. Coagulation Profile:  Recent Labs Lab 05/16/16 0418  INR 1.07   Cardiac Enzymes: No  results for input(s): CKTOTAL, CKMB, CKMBINDEX, TROPONINI in the last 168 hours. BNP (last 3 results) No results for input(s): PROBNP in the last 8760 hours. HbA1C: No results for input(s): HGBA1C in the last 72 hours. CBG: No results for input(s): GLUCAP in the last 168 hours. Lipid Profile: No results for input(s): CHOL, HDL, LDLCALC, TRIG, CHOLHDL, LDLDIRECT in the last 72 hours. Thyroid Function Tests: No results for input(s): TSH, T4TOTAL, FREET4, T3FREE, THYROIDAB in the last 72 hours. Anemia Panel: No results for input(s): VITAMINB12, FOLATE, FERRITIN, TIBC, IRON, RETICCTPCT in the last 72 hours. Urine analysis:    Component Value Date/Time   COLORURINE AMBER (A) 05/16/2016 0031   APPEARANCEUR CLEAR 05/16/2016 0031   LABSPEC 1.027 05/16/2016 0031   PHURINE 5.5 05/16/2016 0031   GLUCOSEU NEGATIVE 05/16/2016 0031   HGBUR NEGATIVE 05/16/2016 0031   BILIRUBINUR NEGATIVE 05/16/2016 0031   KETONESUR 15 (A) 05/16/2016 0031   PROTEINUR 30 (A) 05/16/2016 0031   UROBILINOGEN 0.2 05/21/2015 1256   NITRITE NEGATIVE 05/16/2016 0031   LEUKOCYTESUR NEGATIVE 05/16/2016 0031   Sepsis Labs: @LABRCNTIP (procalcitonin:4,lacticidven:4)  ) Recent Results (from the past 240 hour(s))  Blood Culture (routine x 2)     Status: None (Preliminary result)   Collection Time: 05/16/16 12:17 AM  Result Value Ref Range Status   Specimen Description BLOOD RIGHT ANTECUBITAL  Final   Special Requests BOTTLES DRAWN AEROBIC AND ANAEROBIC 5CC EACH  Final   Culture  Setup Time   Final    GRAM POSITIVE COCCI IN CLUSTERS IN BOTH AEROBIC AND ANAEROBIC BOTTLES CRITICAL RESULT CALLED TO, READ BACK BY AND VERIFIED WITH: A RUNYON,PHARMD AT 0708 05/17/16 BY L BENFIELD Performed at [facility]    Culture [facility] POSITIVE COCCI  Final   Report Status PENDING  Incomplete  Blood Culture ID Panel (Reflexed)     Status: Abnormal   Collection Time: 05/16/16 12:17 AM  Result Value Ref Range Status   Enterococcus  species NOT DETECTED NOT DETECTED Final   Listeria monocytogenes NOT DETECTED NOT DETECTED Final   Staphylococcus species DETECTED (A) NOT DETECTED Final    Comment: CRITICAL RESULT CALLED TO, READ BACK BY AND VERIFIED WITH: A RUNYON,PHARMD AT 0708 05/17/16 BY L BENFIELD    Staphylococcus aureus NOT DETECTED NOT DETECTED Final   Methicillin resistance DETECTED (A) NOT DETECTED Final    Comment: CRITICAL RESULT CALLED TO, READ BACK BY AND VERIFIED WITH: A RUNYON,PHARMD AT 0708 05/17/16 BY L BENFIELD    Streptococcus species DETECTED (A) NOT DETECTED Final    Comment: CRITICAL RESULT CALLED TO, READ BACK BY AND VERIFIED WITH: A RUNYON,PHARMD AT 0708 05/17/16 BY L BENFIELD    Streptococcus agalactiae NOT DETECTED NOT DETECTED Final   Streptococcus pneumoniae  NOT DETECTED NOT DETECTED Final   Streptococcus pyogenes NOT DETECTED NOT DETECTED Final   Acinetobacter baumannii NOT DETECTED NOT DETECTED Final   Enterobacteriaceae species NOT DETECTED NOT DETECTED Final   Enterobacter cloacae complex NOT DETECTED NOT DETECTED Final   Escherichia coli NOT DETECTED NOT DETECTED Final   Klebsiella oxytoca NOT DETECTED NOT DETECTED Final   Klebsiella pneumoniae NOT DETECTED NOT DETECTED Final   Proteus species NOT DETECTED NOT DETECTED Final   Serratia marcescens NOT DETECTED NOT DETECTED Final   Haemophilus influenzae NOT DETECTED NOT DETECTED Final   Neisseria meningitidis NOT DETECTED NOT DETECTED Final   Pseudomonas aeruginosa NOT DETECTED NOT DETECTED Final   Candida albicans NOT DETECTED NOT DETECTED Final   Candida glabrata NOT DETECTED NOT DETECTED Final   Candida krusei NOT DETECTED NOT DETECTED Final   Candida parapsilosis NOT DETECTED NOT DETECTED Final   Candida tropicalis NOT DETECTED NOT DETECTED Final    Comment: Performed at [facility]  Urine culture     Status: Abnormal   Collection Time: 05/16/16 12:31 AM  Result Value Ref Range Status   Specimen Description URINE,  RANDOM  Final   Special Requests NONE  Final   Culture MULTIPLE SPECIES PRESENT, SUGGEST RECOLLECTION (A)  Final   Report Status 05/17/2016 FINAL  Final  Respiratory Panel by PCR     Status: None   Collection Time: 05/16/16  5:16 AM  Result Value Ref Range Status   Adenovirus NOT DETECTED NOT DETECTED Final   Coronavirus 229E NOT DETECTED NOT DETECTED Final   Coronavirus HKU1 NOT DETECTED NOT DETECTED Final   Coronavirus NL63 NOT DETECTED NOT DETECTED Final   Coronavirus OC43 NOT DETECTED NOT DETECTED Final   Metapneumovirus NOT DETECTED NOT DETECTED Final   Rhinovirus / Enterovirus NOT DETECTED NOT DETECTED Final   Influenza A NOT DETECTED NOT DETECTED Final   Influenza B NOT DETECTED NOT DETECTED Final   Parainfluenza Virus 1 NOT DETECTED NOT DETECTED Final   Parainfluenza Virus 2 NOT DETECTED NOT DETECTED Final   Parainfluenza Virus 3 NOT DETECTED NOT DETECTED Final   Parainfluenza Virus 4 NOT DETECTED NOT DETECTED Final   Respiratory Syncytial Virus NOT DETECTED NOT DETECTED Final   Bordetella pertussis NOT DETECTED NOT DETECTED Final   Chlamydophila pneumoniae NOT DETECTED NOT DETECTED Final   Mycoplasma pneumoniae NOT DETECTED NOT DETECTED Final    Comment: Performed at [facility]  MRSA PCR Screening     Status: None   Collection Time: 05/16/16  5:28 AM  Result Value Ref Range Status   MRSA by PCR NEGATIVE NEGATIVE Final    Comment:        The GeneXpert MRSA Assay (FDA approved for NASAL specimens only), is one component of a comprehensive MRSA colonization surveillance program. It is not intended to diagnose MRSA infection nor to guide or monitor treatment for MRSA infections.   Culture, blood (routine x 2)     Status: None (Preliminary result)   Collection Time: 05/17/16  8:20 AM  Result Value Ref Range Status   Specimen Description   Final    BLOOD RIGHT ANTECUBITAL Performed at [facility]    Special Requests NONE  Final   Culture PENDING   Incomplete   Report Status PENDING  Incomplete  Culture, blood (routine x 2)     Status: None (Preliminary result)   Collection Time: 05/17/16  8:25 AM  Result Value Ref Range Status   Specimen Description   Final  BLOOD RIGHT HAND Performed at [facility]    Special Requests NONE  Final   Culture PENDING  Incomplete   Report Status PENDING  Incomplete     Studies: No results found.  Scheduled Meds: . acetaminophen  500 mg Oral TID  . divalproex  250 mg Oral TID  . donepezil  10 mg Oral Daily  . enoxaparin (LOVENOX) injection  40 mg Subcutaneous Q24H  . estrogens (conjugated)  0.625 mg Oral Daily  . ketotifen  1 drop Both Eyes Daily  . memantine  10 mg Oral BID  . piperacillin-tazobactam (ZOSYN)  IV  3.375 g Intravenous Q8H  . potassium chloride  40 mEq Oral BID  . QUEtiapine  50 mg Oral TID  . vancomycin  750 mg Intravenous Q12H  . Vilazodone HCl  40 mg Oral Daily  . zolpidem  5 mg Oral QHS    Continuous Infusions: . sodium chloride 100 mL/hr at 05/17/16 0500     LOS: 1 day   Time spent: 22 minutes  Tom Ragsdale Vergie LivingMohammed Fabrizzio Marcella, MD [facility] Pager [phone]  If 7PM-7AM, please contact night-coverage [email or web address] Password [email or web address] 05/17/2016, 11:46 AM

## 2016-05-17 NOTE — Progress Notes (Signed)
PHARMACY - PHYSICIAN COMMUNICATION CRITICAL VALUE ALERT - BLOOD CULTURE IDENTIFICATION (BCID)  Results for orders placed or performed during the hospital encounter of 05/15/16  Blood Culture ID Panel (Reflexed) (Collected: 05/16/2016 12:17 AM)  Result Value Ref Range   Enterococcus species NOT DETECTED NOT DETECTED   Listeria monocytogenes NOT DETECTED NOT DETECTED   Staphylococcus species DETECTED (A) NOT DETECTED   Staphylococcus aureus NOT DETECTED NOT DETECTED   Methicillin resistance DETECTED (A) NOT DETECTED   Streptococcus species DETECTED (A) NOT DETECTED   Streptococcus agalactiae NOT DETECTED NOT DETECTED   Streptococcus pneumoniae NOT DETECTED NOT DETECTED   Streptococcus pyogenes NOT DETECTED NOT DETECTED   Acinetobacter baumannii NOT DETECTED NOT DETECTED   Enterobacteriaceae species NOT DETECTED NOT DETECTED   Enterobacter cloacae complex NOT DETECTED NOT DETECTED   Escherichia coli NOT DETECTED NOT DETECTED   Klebsiella oxytoca NOT DETECTED NOT DETECTED   Klebsiella pneumoniae NOT DETECTED NOT DETECTED   Proteus species NOT DETECTED NOT DETECTED   Serratia marcescens NOT DETECTED NOT DETECTED   Haemophilus influenzae NOT DETECTED NOT DETECTED   Neisseria meningitidis NOT DETECTED NOT DETECTED   Pseudomonas aeruginosa NOT DETECTED NOT DETECTED   Candida albicans NOT DETECTED NOT DETECTED   Candida glabrata NOT DETECTED NOT DETECTED   Candida krusei NOT DETECTED NOT DETECTED   Candida parapsilosis NOT DETECTED NOT DETECTED   Candida tropicalis NOT DETECTED NOT DETECTED    Name of physician (or Provider) Contacted: Dr. Kirby CriglerIkramullah  Changes to prescribed antibiotics required: will continue vancomycin and Zosyn for now per MD  Clance Bollunyon, Sherrilyn Nairn 05/17/2016  7:17 AM

## 2016-05-18 LAB — CBC
HEMATOCRIT: 35 % — AB (ref 36.0–46.0)
HEMOGLOBIN: 11.2 g/dL — AB (ref 12.0–15.0)
MCH: 28.6 pg (ref 26.0–34.0)
MCHC: 32 g/dL (ref 30.0–36.0)
MCV: 89.3 fL (ref 78.0–100.0)
Platelets: 151 10*3/uL (ref 150–400)
RBC: 3.92 MIL/uL (ref 3.87–5.11)
RDW: 14 % (ref 11.5–15.5)
WBC: 3.9 10*3/uL — ABNORMAL LOW (ref 4.0–10.5)

## 2016-05-18 LAB — BASIC METABOLIC PANEL
ANION GAP: 8 (ref 5–15)
BUN: 6 mg/dL (ref 6–20)
CO2: 28 mmol/L (ref 22–32)
Calcium: 8 mg/dL — ABNORMAL LOW (ref 8.9–10.3)
Chloride: 106 mmol/L (ref 101–111)
Creatinine, Ser: 0.89 mg/dL (ref 0.44–1.00)
GFR calc Af Amer: >60 mL/min (ref >60)
GFR calc non Af Amer: 59 mL/min — ABNORMAL LOW (ref >60)
GLUCOSE: 87 mg/dL (ref 65–99)
POTASSIUM: 3.3 mmol/L — AB (ref 3.5–5.1)
Sodium: 142 mmol/L (ref 135–145)

## 2016-05-18 MED ORDER — DIVALPROEX SODIUM 125 MG PO CSDR
250.0000 mg | DELAYED_RELEASE_CAPSULE | Freq: Three times a day (TID) | ORAL | Status: DC
Start: 2016-05-18 — End: 2016-05-20
  Administered 2016-05-18 – 2016-05-20 (×6): 250 mg via ORAL
  Filled 2016-05-18 (×6): qty 2

## 2016-05-18 MED ORDER — POTASSIUM CHLORIDE CRYS ER 20 MEQ PO TBCR
40.0000 meq | EXTENDED_RELEASE_TABLET | Freq: Three times a day (TID) | ORAL | Status: AC
  Administered 2016-05-18 – 2016-05-19 (×6): 40 meq via ORAL
  Filled 2016-05-18 (×6): qty 2

## 2016-05-18 NOTE — Progress Notes (Signed)
PROGRESS NOTE  Pamela Cummings ZOX:[id number] DOB: [birth year] DOA: 05/15/2016 PCP: Minda Meo, MD  Hospital Course/Subjective: 82 y.o. female with medical history significant of dementia with behavior disturbance, wheelchair-bound, depression, Bell's palsy, who presents with lethargy, fever, cough, shortness breath. She was diagnosed with HCAP as well as sepsis, treated with IV fluids, IV antibiotics.   She is stable today, resting, no acute complaints. Seems less confused today, her daughter and daughter's friend are at the bedside this AM.  Assessment/Plan: Sepsis possibly due to HCAP: Patient is septic with elevated lactate at the time of admission, fever and tachypnea. Currently hemodynamically stable. Likely due to HCAP. Aspiration pneumonia cannot be completely ruled out. She has bacteremia with positive blood culture and we are awaiting final results, as well as results of repeat culture drawn 9/23. - continue IV Vancomycin and Zosyn for now, tailor to cultures - SLP - Mucinex for cough  - prn Albuterol Nebs for SOB - Follow up blood culture x2, sputum culture and respiratory virus panel - IVF: reduce to 50 mL per hour of NS due to hypertension  Dementia with behavioral disturbance: -on Depakote, donepezil, Namenda  -Haldol prn   Depression: Stable, no suicidal or homicidal ideations. -Continue home medications: Vilazodone and Seroquel  HTN: not on meds at home. bp 140/62 -monitor Bp   Hypokalemia:  - replete orally  DVT Prophylaxis: Lovenox  Code Status: DNR   Family Communication: Daughter at bedside, updated.  Disposition Plan: Back to memory care once improved in 2-3 days.   Consultants:  None   Procedures:  None   Antimicrobials:  Vanco/Zosyn 9/22 -->    Objective: Vitals:   05/17/16 0708 05/17/16 1430 05/17/16 2104 05/18/16 0519  BP: (!) 134/56 (!) 196/71 (!) 136/46 (!) 146/58  Pulse: (!) 58 73 (!) 50 (!) 48  Resp: 20 18 18 18     Temp: 98 F (36.7 C) 99.1 F (37.3 C) 98.5 F (36.9 C) 97.4 F (36.3 C)  TempSrc: Oral Axillary Axillary Oral  SpO2: 98% 95% 96% 96%  Weight:      Height:        Intake/Output Summary (Last 24 hours) at 05/18/16 0910 Last data filed at 05/18/16 0550  Gross per 24 hour  Intake             1260 ml  Output                0 ml  Net             1260 ml   Filed Weights   05/16/16 0014  Weight: 81.6 kg (180 lb)     Exam: General:  Alert, oriented, calm, in no acute distress, resting comfortably, on O2, tired appearing Neck: supple, no masses, trachea mildline  Cardiovascular: RRR, no murmurs or rubs, no peripheral edema  Respiratory: clear to auscultation bilaterally, no wheezes, no crackles  Abdomen: soft, nontender, nondistended, normal bowel tones heard  Skin: dry, no rashes  Musculoskeletal: no joint effusions, normal range of motion  Psychiatric: appropriate affect, normal speech  Neurologic: extraocular muscles intact, clear speech, moving all extremities with intact sensorium    Data Reviewed: CBC:  Recent Labs Lab 05/16/16 0051 05/16/16 0418 05/17/16 0613 05/18/16 0524  WBC 4.3 4.7 6.1 3.9*  NEUTROABS 3.7  --   --   --   HGB 13.8 11.1* 11.6* 11.2*  HCT 39.8 34.3* 36.8 35.0*  MCV 88.6 91.2 90.0 89.3  PLT PLATELET CLUMPS NOTED ON SMEAR, UNABLE  TO ESTIMATE 122* 137* 151   Basic Metabolic Panel:  Recent Labs Lab 05/16/16 0023 05/16/16 0418 05/17/16 0613 05/18/16 0524  NA 140 141 141 142  K 3.4* 3.3* 3.2* 3.3*  CL 102 108 108 106  CO2 27 26 25 28   GLUCOSE 106* 107* 90 87  BUN 18 15 8 6   CREATININE 0.83 0.79 0.84 0.89  CALCIUM 9.1 7.8* 8.0* 8.0*   GFR: Estimated Creatinine Clearance: 54.5 mL/min (by C-G formula based on SCr of 0.89 mg/dL). Liver Function Tests:  Recent Labs Lab 05/16/16 0023  AST 16  ALT 11*  ALKPHOS 64  BILITOT 0.5  PROT 6.9  ALBUMIN 3.7   No results for input(s): LIPASE, AMYLASE in the last 168 hours. No results for  input(s): AMMONIA in the last 168 hours. Coagulation Profile:  Recent Labs Lab 05/16/16 0418  INR 1.07   Cardiac Enzymes: No results for input(s): CKTOTAL, CKMB, CKMBINDEX, TROPONINI in the last 168 hours. BNP (last 3 results) No results for input(s): PROBNP in the last 8760 hours. HbA1C: No results for input(s): HGBA1C in the last 72 hours. CBG: No results for input(s): GLUCAP in the last 168 hours. Lipid Profile: No results for input(s): CHOL, HDL, LDLCALC, TRIG, CHOLHDL, LDLDIRECT in the last 72 hours. Thyroid Function Tests: No results for input(s): TSH, T4TOTAL, FREET4, T3FREE, THYROIDAB in the last 72 hours. Anemia Panel: No results for input(s): VITAMINB12, FOLATE, FERRITIN, TIBC, IRON, RETICCTPCT in the last 72 hours. Urine analysis:    Component Value Date/Time   COLORURINE AMBER (A) 05/16/2016 0031   APPEARANCEUR CLEAR 05/16/2016 0031   LABSPEC 1.027 05/16/2016 0031   PHURINE 5.5 05/16/2016 0031   GLUCOSEU NEGATIVE 05/16/2016 0031   HGBUR NEGATIVE 05/16/2016 0031   BILIRUBINUR NEGATIVE 05/16/2016 0031   KETONESUR 15 (A) 05/16/2016 0031   PROTEINUR 30 (A) 05/16/2016 0031   UROBILINOGEN 0.2 05/21/2015 1256   NITRITE NEGATIVE 05/16/2016 0031   LEUKOCYTESUR NEGATIVE 05/16/2016 0031   Sepsis Labs: @LABRCNTIP (procalcitonin:4,lacticidven:4)  ) Recent Results (from the past 240 hour(s))  Blood Culture (routine x 2)     Status: None (Preliminary result)   Collection Time: 05/16/16 12:17 AM  Result Value Ref Range Status   Specimen Description BLOOD RIGHT ANTECUBITAL  Final   Special Requests BOTTLES DRAWN AEROBIC AND ANAEROBIC 5CC EACH  Final   Culture  Setup Time   Final    GRAM POSITIVE COCCI IN CLUSTERS IN BOTH AEROBIC AND ANAEROBIC BOTTLES CRITICAL RESULT CALLED TO, READ BACK BY AND VERIFIED WITH: A RUNYON,PHARMD AT 0708 05/17/16 BY L BENFIELD [facility]    [facility]  Final   Report Status PENDING  Incomplete  Blood  Culture ID Panel (Reflexed)     Status: Abnormal   Collection Time: 05/16/16 12:17 AM  Result Value Ref Range Status   Enterococcus species NOT DETECTED NOT DETECTED Final   Listeria monocytogenes NOT DETECTED NOT DETECTED Final   Staphylococcus species DETECTED (A) NOT DETECTED Final    Comment: CRITICAL RESULT CALLED TO, READ BACK BY AND VERIFIED WITH: A RUNYON,PHARMD AT 0708 05/17/16 BY L BENFIELD    Staphylococcus aureus NOT DETECTED NOT DETECTED Final   Methicillin resistance DETECTED (A) NOT DETECTED Final    Comment: CRITICAL RESULT CALLED TO, READ BACK BY AND VERIFIED WITH: A RUNYON,PHARMD AT 0708 05/17/16 BY L BENFIELD    Streptococcus species DETECTED (A) NOT DETECTED Final    Comment: CRITICAL RESULT CALLED TO, READ BACK BY AND VERIFIED  WITH: A RUNYON,PHARMD AT 0708 05/17/16 BY L BENFIELD    Streptococcus agalactiae NOT DETECTED NOT DETECTED Final   Streptococcus pneumoniae NOT DETECTED NOT DETECTED Final   Streptococcus pyogenes NOT DETECTED NOT DETECTED Final   Acinetobacter baumannii NOT DETECTED NOT DETECTED Final   Enterobacteriaceae species NOT DETECTED NOT DETECTED Final   Enterobacter cloacae complex NOT DETECTED NOT DETECTED Final   Escherichia coli NOT DETECTED NOT DETECTED Final   Klebsiella oxytoca NOT DETECTED NOT DETECTED Final   Klebsiella pneumoniae NOT DETECTED NOT DETECTED Final   Proteus species NOT DETECTED NOT DETECTED Final   Serratia marcescens NOT DETECTED NOT DETECTED Final   Haemophilus influenzae NOT DETECTED NOT DETECTED Final   Neisseria meningitidis NOT DETECTED NOT DETECTED Final   Pseudomonas aeruginosa NOT DETECTED NOT DETECTED Final   Candida albicans NOT DETECTED NOT DETECTED Final   Candida glabrata NOT DETECTED NOT DETECTED Final   Candida krusei NOT DETECTED NOT DETECTED Final   Candida parapsilosis NOT DETECTED NOT DETECTED Final   Candida tropicalis NOT DETECTED NOT DETECTED Final    Comment: Performed at [facility]  Urine  culture     Status: Abnormal   Collection Time: 05/16/16 12:31 AM  Result Value Ref Range Status   Specimen Description URINE, RANDOM  Final   Special Requests NONE  Final   Culture MULTIPLE SPECIES PRESENT, SUGGEST RECOLLECTION (A)  Final   Report Status 05/17/2016 FINAL  Final  Blood Culture (routine x 2)     Status: None (Preliminary result)   Collection Time: 05/16/16 12:45 AM  Result Value Ref Range Status   Specimen Description BLOOD RIGHT HAND  Final   Special Requests IN PEDIATRIC BOTTLE 4CC  Final   Culture NO GROWTH 1 DAY  Final   Report Status PENDING  Incomplete  Respiratory Panel by PCR     Status: None   Collection Time: 05/16/16  5:16 AM  Result Value Ref Range Status   Adenovirus NOT DETECTED NOT DETECTED Final   Coronavirus 229E NOT DETECTED NOT DETECTED Final   Coronavirus HKU1 NOT DETECTED NOT DETECTED Final   Coronavirus NL63 NOT DETECTED NOT DETECTED Final   Coronavirus OC43 NOT DETECTED NOT DETECTED Final   Metapneumovirus NOT DETECTED NOT DETECTED Final   Rhinovirus / Enterovirus NOT DETECTED NOT DETECTED Final   Influenza A NOT DETECTED NOT DETECTED Final   Influenza B NOT DETECTED NOT DETECTED Final   Parainfluenza Virus 1 NOT DETECTED NOT DETECTED Final   Parainfluenza Virus 2 NOT DETECTED NOT DETECTED Final   Parainfluenza Virus 3 NOT DETECTED NOT DETECTED Final   Parainfluenza Virus 4 NOT DETECTED NOT DETECTED Final   Respiratory Syncytial Virus NOT DETECTED NOT DETECTED Final   Bordetella pertussis NOT DETECTED NOT DETECTED Final   Chlamydophila pneumoniae NOT DETECTED NOT DETECTED Final   Mycoplasma pneumoniae NOT DETECTED NOT DETECTED Final    Comment: Performed at [facility]  MRSA PCR Screening     Status: None   Collection Time: 05/16/16  5:28 AM  Result Value Ref Range Status   MRSA by PCR NEGATIVE NEGATIVE Final    Comment:        The GeneXpert MRSA Assay (FDA approved for NASAL specimens only), is one component of a comprehensive  MRSA colonization surveillance program. It is not intended to diagnose MRSA infection nor to guide or monitor treatment for MRSA infections.   Culture, blood (routine x 2)     Status: None (Preliminary result)   Collection  Time: 05/17/16  8:20 AM  Result Value Ref Range Status   Specimen Description   Final    BLOOD RIGHT ANTECUBITAL Performed at [facility]    Special Requests NONE  Final   Culture PENDING  Incomplete   Report Status PENDING  Incomplete  Culture, blood (routine x 2)     Status: None (Preliminary result)   Collection Time: 05/17/16  8:25 AM  Result Value Ref Range Status   Specimen Description   Final    BLOOD RIGHT HAND Performed at [facility]    Special Requests NONE  Final   Culture PENDING  Incomplete   Report Status PENDING  Incomplete     Studies: No results found.  Scheduled Meds: . acetaminophen  500 mg Oral TID  . divalproex  250 mg Oral TID  . donepezil  10 mg Oral Daily  . enoxaparin (LOVENOX) injection  40 mg Subcutaneous Q24H  . estrogens (conjugated)  0.625 mg Oral Daily  . ketotifen  1 drop Both Eyes Daily  . memantine  10 mg Oral BID  . piperacillin-tazobactam (ZOSYN)  IV  3.375 g Intravenous Q8H  . potassium chloride  40 mEq Oral TID  . QUEtiapine  50 mg Oral TID  . vancomycin  1,000 mg Intravenous Q12H  . Vilazodone HCl  40 mg Oral Daily  . zolpidem  5 mg Oral QHS    Continuous Infusions: . sodium chloride 100 mL/hr at 05/18/16 0553     LOS: 2 days   Time spent: 25 minutes  Mir Vergie Living, MD [facility] Pager [phone]  If 7PM-7AM, please contact night-coverage [email or web address] Password [email or web address] 05/18/2016, 9:10 AM

## 2016-05-19 LAB — BASIC METABOLIC PANEL
ANION GAP: 8 (ref 5–15)
BUN: 7 mg/dL (ref 6–20)
CO2: 26 mmol/L (ref 22–32)
Calcium: 8.5 mg/dL — ABNORMAL LOW (ref 8.9–10.3)
Chloride: 109 mmol/L (ref 101–111)
Creatinine, Ser: 0.97 mg/dL (ref 0.44–1.00)
GFR calc Af Amer: >60 mL/min (ref >60)
GFR, EST NON AFRICAN AMERICAN: 53 mL/min — AB (ref >60)
GLUCOSE: 84 mg/dL (ref 65–99)
POTASSIUM: 4.4 mmol/L (ref 3.5–5.1)
Sodium: 143 mmol/L (ref 135–145)

## 2016-05-19 LAB — CULTURE, BLOOD (ROUTINE X 2)

## 2016-05-19 LAB — CBC
HEMATOCRIT: 39.1 % (ref 36.0–46.0)
HEMOGLOBIN: 12.3 g/dL (ref 12.0–15.0)
MCH: 29 pg (ref 26.0–34.0)
MCHC: 31.5 g/dL (ref 30.0–36.0)
MCV: 92.2 fL (ref 78.0–100.0)
PLATELETS: 171 10*3/uL (ref 150–400)
RBC: 4.24 MIL/uL (ref 3.87–5.11)
RDW: 14.3 % (ref 11.5–15.5)
WBC: 5.5 10*3/uL (ref 4.0–10.5)

## 2016-05-19 LAB — LEGIONELLA PNEUMOPHILA SEROGP 1 UR AG: L. pneumophila Serogp 1 Ur Ag: NEGATIVE

## 2016-05-19 MED ORDER — SODIUM CHLORIDE 0.9 % IV SOLN
1.5000 g | Freq: Four times a day (QID) | INTRAVENOUS | Status: DC
  Administered 2016-05-19 – 2016-05-20 (×5): 1.5 g via INTRAVENOUS
  Filled 2016-05-19 (×6): qty 1.5

## 2016-05-19 NOTE — Progress Notes (Addendum)
PROGRESS NOTE  Pamela Cummings YNW:[id number] DOB: 1931-09-11 DOA: 05/15/2016 PCP: Minda Meo, MD  Hospital Course/Subjective: 82 y.o. female with medical history significant of dementia with behavior disturbance, wheelchair-bound, depression, Bell's palsy, who presents with lethargy, fever, cough, shortness breath. She was diagnosed with HCAP as well as sepsis, treated with IV fluids, IV antibiotics.   She is stable today, resting, no acute complaints. Seems less confused today, her daughter and daughter's friend are at the bedside this AM.  Assessment/Plan: Sepsis possibly due to HCAP: Patient is septic with elevated lactate at the time of admission, fever and tachypnea. Currently hemodynamically stable. Likely due to HCAP. Aspiration pneumonia cannot be completely ruled out. She has bacteremia with positive blood culture and we are awaiting final results, as well as results of repeat culture drawn 9/23. - continue IV Vancomycin and narrow Zosyn to Unasyn 9/25 for now, tailor to cultures - Mucinex for cough  - prn Albuterol Nebs for SOB - Follow up blood culture x2, sputum culture, respiratory virus panel negative - d/c IVF today as she's eating, Cr and BP normal  Dementia with behavioral disturbance: -on Depakote, donepezil, Namenda  -Haldol prn   Depression: Stable, no suicidal or homicidal ideations. -Continue home medications: Vilazodone and Seroquel  HTN: not on meds at home. bp normal -monitor Bp   Hypokalemia:  - replete orally  DVT Prophylaxis: Lovenox  Code Status: DNR   Family Communication: Daughter at bedside, updated.  Disposition Plan: Back to memory care once we have culture data, hopefully tomorrow.  Consultants:  None   Procedures:  None   Antimicrobials:  Vanco 9/22 -->   Zosyn 9/22 to 9/25  Unasyn 9/25 -->   Objective: Vitals:   05/18/16 0519 05/18/16 1400 05/18/16 2053 05/19/16 0650  BP: (!) 146/58 (!) 152/69 (!) 149/59  (!) 126/47  Pulse: (!) 48 (!) 58 (!) 52 74  Resp: 18 18    Temp: 97.4 F (36.3 C) 98.1 F (36.7 C) 97.7 F (36.5 C) 98.4 F (36.9 C)  TempSrc: Oral Oral Oral Oral  SpO2: 96% 96% 96% 95%  Weight:      Height:        Intake/Output Summary (Last 24 hours) at 05/19/16 0944 Last data filed at 05/19/16 6578  Gross per 24 hour  Intake              970 ml  Output                0 ml  Net              970 ml   Filed Weights   05/16/16 0014  Weight: 81.6 kg (180 lb)     Exam: General:  Alert, oriented, calm, in no acute distress, resting comfortably, on O2, tired appearing Neck: supple, no masses, trachea mildline  Cardiovascular: RRR, no murmurs or rubs, no peripheral edema  Respiratory: clear to auscultation bilaterally, no wheezes, no crackles  Abdomen: soft, nontender, nondistended, normal bowel tones heard  Skin: dry, no rashes  Musculoskeletal: no joint effusions, normal range of motion  Psychiatric: appropriate affect, normal speech  Neurologic: extraocular muscles intact, clear speech, moving all extremities with intact sensorium   Data Reviewed: CBC:  Recent Labs Lab 05/16/16 0051 05/16/16 0418 05/17/16 0613 05/18/16 0524 05/19/16 0519  WBC 4.3 4.7 6.1 3.9* 5.5  NEUTROABS 3.7  --   --   --   --   HGB 13.8 11.1* 11.6* 11.2* 12.3  HCT 39.8  34.3* 36.8 35.0* 39.1  MCV 88.6 91.2 90.0 89.3 92.2  PLT PLATELET CLUMPS NOTED ON SMEAR, UNABLE TO ESTIMATE 122* 137* 151 171   Basic Metabolic Panel:  Recent Labs Lab 05/16/16 0023 05/16/16 0418 05/17/16 0613 05/18/16 0524 05/19/16 0519  NA 140 141 141 142 143  K 3.4* 3.3* 3.2* 3.3* 4.4  CL 102 108 108 106 109  CO2 27 26 25 28 26   GLUCOSE 106* 107* 90 87 84  BUN 18 15 8 6 7   CREATININE 0.83 0.79 0.84 0.89 0.97  CALCIUM 9.1 7.8* 8.0* 8.0* 8.5*   GFR: Estimated Creatinine Clearance: 50 mL/min (by C-G formula based on SCr of 0.97 mg/dL). Liver Function Tests:  Recent Labs Lab 05/16/16 0023  AST 16  ALT 11*   ALKPHOS 64  BILITOT 0.5  PROT 6.9  ALBUMIN 3.7   No results for input(s): LIPASE, AMYLASE in the last 168 hours. No results for input(s): AMMONIA in the last 168 hours. Coagulation Profile:  Recent Labs Lab 05/16/16 0418  INR 1.07   Cardiac Enzymes: No results for input(s): CKTOTAL, CKMB, CKMBINDEX, TROPONINI in the last 168 hours. BNP (last 3 results) No results for input(s): PROBNP in the last 8760 hours. HbA1C: No results for input(s): HGBA1C in the last 72 hours. CBG: No results for input(s): GLUCAP in the last 168 hours. Lipid Profile: No results for input(s): CHOL, HDL, LDLCALC, TRIG, CHOLHDL, LDLDIRECT in the last 72 hours. Thyroid Function Tests: No results for input(s): TSH, T4TOTAL, FREET4, T3FREE, THYROIDAB in the last 72 hours. Anemia Panel: No results for input(s): VITAMINB12, FOLATE, FERRITIN, TIBC, IRON, RETICCTPCT in the last 72 hours. Urine analysis:    Component Value Date/Time   COLORURINE AMBER (A) 05/16/2016 0031   APPEARANCEUR CLEAR 05/16/2016 0031   LABSPEC 1.027 05/16/2016 0031   PHURINE 5.5 05/16/2016 0031   GLUCOSEU NEGATIVE 05/16/2016 0031   HGBUR NEGATIVE 05/16/2016 0031   BILIRUBINUR NEGATIVE 05/16/2016 0031   KETONESUR 15 (A) 05/16/2016 0031   PROTEINUR 30 (A) 05/16/2016 0031   UROBILINOGEN 0.2 05/21/2015 1256   NITRITE NEGATIVE 05/16/2016 0031   LEUKOCYTESUR NEGATIVE 05/16/2016 0031   Sepsis Labs: @LABRCNTIP (procalcitonin:4,lacticidven:4)  ) Recent Results (from the past 240 hour(s))  Blood Culture (routine x 2)     Status: None (Preliminary result)   Collection Time: 05/16/16 12:17 AM  Result Value Ref Range Status   Specimen Description BLOOD RIGHT ANTECUBITAL  Final   Special Requests BOTTLES DRAWN AEROBIC AND ANAEROBIC 5CC EACH  Final   Culture  Setup Time   Final    GRAM POSITIVE COCCI IN CLUSTERS IN BOTH AEROBIC AND ANAEROBIC BOTTLES CRITICAL RESULT CALLED TO, READ BACK BY AND VERIFIED WITH: A RUNYON,PHARMD AT 0708  05/17/16 BY L BENFIELD    Culture   Final    GRAM POSITIVE COCCI CULTURE REINCUBATED FOR BETTER GROWTH Performed at [facility]    Report Status PENDING  Incomplete  Blood Culture ID Panel (Reflexed)     Status: Abnormal   Collection Time: 05/16/16 12:17 AM  Result Value Ref Range Status   Enterococcus species NOT DETECTED NOT DETECTED Final   Listeria monocytogenes NOT DETECTED NOT DETECTED Final   Staphylococcus species DETECTED (A) NOT DETECTED Final    Comment: CRITICAL RESULT CALLED TO, READ BACK BY AND VERIFIED WITH: A RUNYON,PHARMD AT 0708 05/17/16 BY L BENFIELD    Staphylococcus aureus NOT DETECTED NOT DETECTED Final   Methicillin resistance DETECTED (A) NOT DETECTED Final    Comment: CRITICAL  RESULT CALLED TO, READ BACK BY AND VERIFIED WITH: A RUNYON,PHARMD AT 0708 05/17/16 BY L BENFIELD    Streptococcus species DETECTED (A) NOT DETECTED Final    Comment: CRITICAL RESULT CALLED TO, READ BACK BY AND VERIFIED WITH: A RUNYON,PHARMD AT 0708 05/17/16 BY L BENFIELD    Streptococcus agalactiae NOT DETECTED NOT DETECTED Final   Streptococcus pneumoniae NOT DETECTED NOT DETECTED Final   Streptococcus pyogenes NOT DETECTED NOT DETECTED Final   Acinetobacter baumannii NOT DETECTED NOT DETECTED Final   Enterobacteriaceae species NOT DETECTED NOT DETECTED Final   Enterobacter cloacae complex NOT DETECTED NOT DETECTED Final   Escherichia coli NOT DETECTED NOT DETECTED Final   Klebsiella oxytoca NOT DETECTED NOT DETECTED Final   Klebsiella pneumoniae NOT DETECTED NOT DETECTED Final   Proteus species NOT DETECTED NOT DETECTED Final   Serratia marcescens NOT DETECTED NOT DETECTED Final   Haemophilus influenzae NOT DETECTED NOT DETECTED Final   Neisseria meningitidis NOT DETECTED NOT DETECTED Final   Pseudomonas aeruginosa NOT DETECTED NOT DETECTED Final   Candida albicans NOT DETECTED NOT DETECTED Final   Candida glabrata NOT DETECTED NOT DETECTED Final   Candida krusei NOT  DETECTED NOT DETECTED Final   Candida parapsilosis NOT DETECTED NOT DETECTED Final   Candida tropicalis NOT DETECTED NOT DETECTED Final    Comment: Performed at [facility]  Urine culture     Status: Abnormal   Collection Time: 05/16/16 12:31 AM  Result Value Ref Range Status   Specimen Description URINE, RANDOM  Final   Special Requests NONE  Final   Culture MULTIPLE SPECIES PRESENT, SUGGEST RECOLLECTION (A)  Final   Report Status 05/17/2016 FINAL  Final  Blood Culture (routine x 2)     Status: None (Preliminary result)   Collection Time: 05/16/16 12:45 AM  Result Value Ref Range Status   Specimen Description BLOOD RIGHT HAND  Final   Special Requests IN PEDIATRIC BOTTLE 4CC  Final   Culture   Final    NO GROWTH 2 DAYS Performed at [facility]    Report Status PENDING  Incomplete  Respiratory Panel by PCR     Status: None   Collection Time: 05/16/16  5:16 AM  Result Value Ref Range Status   Adenovirus NOT DETECTED NOT DETECTED Final   Coronavirus 229E NOT DETECTED NOT DETECTED Final   Coronavirus HKU1 NOT DETECTED NOT DETECTED Final   Coronavirus NL63 NOT DETECTED NOT DETECTED Final   Coronavirus OC43 NOT DETECTED NOT DETECTED Final   Metapneumovirus NOT DETECTED NOT DETECTED Final   Rhinovirus / Enterovirus NOT DETECTED NOT DETECTED Final   Influenza A NOT DETECTED NOT DETECTED Final   Influenza B NOT DETECTED NOT DETECTED Final   Parainfluenza Virus 1 NOT DETECTED NOT DETECTED Final   Parainfluenza Virus 2 NOT DETECTED NOT DETECTED Final   Parainfluenza Virus 3 NOT DETECTED NOT DETECTED Final   Parainfluenza Virus 4 NOT DETECTED NOT DETECTED Final   Respiratory Syncytial Virus NOT DETECTED NOT DETECTED Final   Bordetella pertussis NOT DETECTED NOT DETECTED Final   Chlamydophila pneumoniae NOT DETECTED NOT DETECTED Final   Mycoplasma pneumoniae NOT DETECTED NOT DETECTED Final    Comment: Performed at [facility]  MRSA PCR Screening     Status: None     Collection Time: 05/16/16  5:28 AM  Result Value Ref Range Status   MRSA by PCR NEGATIVE NEGATIVE Final    Comment:        The GeneXpert MRSA Assay (  FDA approved for NASAL specimens only), is one component of a comprehensive MRSA colonization surveillance program. It is not intended to diagnose MRSA infection nor to guide or monitor treatment for MRSA infections.   Culture, blood (routine x 2)     Status: None (Preliminary result)   Collection Time: 05/17/16  8:20 AM  Result Value Ref Range Status   Specimen Description BLOOD RIGHT ANTECUBITAL  Final   Special Requests NONE  Final   Culture   Final    NO GROWTH 1 DAY [facility]    Report Status PENDING  Incomplete  Culture, blood (routine x 2)     Status: None (Preliminary result)   Collection Time: 05/17/16  8:25 AM  Result Value Ref Range Status   Specimen Description BLOOD RIGHT HAND  Final   Special Requests NONE  Final   Culture   Final    NO GROWTH 1 DAY Performed at [facility]    Report Status PENDING  Incomplete     Studies: No results found.  Scheduled Meds: . acetaminophen  500 mg Oral TID  . divalproex  250 mg Oral Q8H  . donepezil  10 mg Oral Daily  . enoxaparin (LOVENOX) injection  40 mg Subcutaneous Q24H  . estrogens (conjugated)  0.625 mg Oral Daily  . ketotifen  1 drop Both Eyes Daily  . memantine  10 mg Oral BID  . piperacillin-tazobactam (ZOSYN)  IV  3.375 g Intravenous Q8H  . potassium chloride  40 mEq Oral TID  . QUEtiapine  50 mg Oral TID  . vancomycin  1,000 mg Intravenous Q12H  . Vilazodone HCl  40 mg Oral Daily  . zolpidem  5 mg Oral QHS    Continuous Infusions: . sodium chloride 50 mL/hr at 05/19/16 0501     LOS: 3 days   Time spent: 22 minutes  Mir Vergie LivingMohammed Ikramullah, MD [facility] Pager [phone]  If 7PM-7AM, please contact night-coverage [email or web address] Password [email or web address] 05/19/2016, 9:44 AM

## 2016-05-19 NOTE — Clinical Social Work Note (Signed)
Clinical Social Work Assessment  Patient Details  Name: Pamela Cummings MRN: [id number] Date of Birth: 12/31/1931  Date of referral:  05/19/16               Reason for consult:  Facility Placement                Permission sought to share information with:  Facility Medical sales representativeContact Representative, Family Supports Permission granted to share information::     Name::        Agency::     Relationship::  Husband  Contact Information:  [phone]  Housing/Transportation Living arrangements for the past 2 months:  Assisted Living Facility ([facility]) Source of Information:  Adult Children, Spouse Patient Interpreter Needed:  None Criminal Activity/Legal Involvement Pertinent to Current Situation/Hospitalization:  No - Comment as needed Significant Relationships:  Adult Children, Spouse Lives with:  Facility Resident Do you feel safe going back to the place where you live?  Yes Need for family participation in patient care:  Yes (Comment)  Care giving concerns:  Patient is from [facility]. Patient daughters and husband report she will return to ALF.   Social Worker assessment / plan: LCSWA will assist patient and family with patient disposition back to ALF. [facility], waiting for return phone call.  Updated FL2 to be faxed.  Patient to be transported by EMS.  Employment status:  Retired Health and safety inspectornsurance information:  Medicare PT Recommendations:   None Information / Referral to community resources:   ALF  Patient/Family's Response to care:  Agreeable and responding to care.  Patient/Family's Understanding of and Emotional Response to Diagnosis, Current Treatment, and Prognosis:  " Patient spouse responds-" Thank you for keeping me updated, she will return to facility at discharge."  Emotional Assessment Appearance:  Appears older than stated age Attitude/Demeanor/Rapport:    Affect (typically observed):  Accepting (Confused) Orientation:  Oriented to  Self Alcohol / Substance use:  Not Applicable Psych involvement (Current and /or in the community):  No (Comment)  Discharge Needs  Concerns to be addressed:  Care Coordination, Discharge Planning Concerns Readmission within the last 30 days:  No Current discharge risk:  None Barriers to Discharge:  Continued Medical Work up   [employer] A Garl Speigner, LCSW 05/19/2016, 10:28 AM

## 2016-05-19 NOTE — [place] FL2 (Signed)
[place] MEDICAID FL2 LEVEL OF CARE SCREENING TOOL     IDENTIFICATION  Patient Name: Pamela Cummings Birthdate: 1931-11-14 Sex: female Admission Date (Current Location): 05/15/2016  [facility] [place]   [facility] Address[facility]  [facility],  [address], [place] [id number]      Provider Number: [id number]  Attending Physician Name and Address:  Mir Vergie LivingMohammed Ikramullah,*  Relative Name and Phone Number:       Current Level of Care: Hospital Recommended Level of Care: Assisted Living Facility Memory Care Prior Approval Number:    Date Approved/Denied:   PASRR Number:    Discharge Plan:  (ALF) Memory Care    Current Diagnoses: Patient Active Problem List   Diagnosis Date Noted  . Sepsis (HCC) 05/16/2016  . HCAP (healthcare-associated pneumonia) 05/16/2016  . Acute encephalopathy 05/16/2016  . Depression 05/16/2016  . Hypokalemia 05/16/2016  . Hypertension   . GERD (gastroesophageal reflux disease)   . Dementia with behavioral disturbance 02/27/2015  . Dementia     Orientation RESPIRATION BLADDER Height & Weight     Self  Normal Incontinent Weight: 81.6 kg (180 lb) Height:  5\' 7"  (170.2 cm)  BEHAVIORAL SYMPTOMS/MOOD NEUROLOGICAL BOWEL NUTRITION STATUS      Incontinent Diet- No Added Salt  AMBULATORY STATUS COMMUNICATION OF NEEDS Skin   Limited Assist/Wheelchair Bound Verbally                         Personal Care Assistance Level of Assistance  Bathing, Feeding, Dressing Bathing Assistance: Limited assistance Feeding assistance: Independent Dressing Assistance: Limited assistance     Functional Limitations Info  Sight, Hearing, Speech Sight Info: Adequate Hearing Info: Adequate Speech Info: Adequate    SPECIAL CARE FACTORS FREQUENCY                       Contractures Contractures Info: Not present    Additional Factors Info  Code Status, Allergies, Psychotropic Code Status Info: DNR Allergies  Info: Demerol Meperidine, Sulfa Antibiotics Psychotropic Info: Depakote, Seroquel,          Current Medications (05/19/2016):  This is the current hospital active medication list Current Facility-Administered Medications  Medication Dose Route Frequency Provider Last Rate Last Dose  . acetaminophen (TYLENOL) tablet 500 mg  500 mg Oral TID Lorretta HarpXilin Niu, MD   500 mg at 05/19/16 1020  . albuterol (PROVENTIL) (2.5 MG/3ML) 0.083% nebulizer solution 2.5 mg  2.5 mg Nebulization Q6H PRN Lorretta HarpXilin Niu, MD      . dextromethorphan-guaiFENesin [facility](MUCINEX DM) 30-600 MG per 12 hr tablet 1 tablet  1 tablet Oral BID PRN Lorretta HarpXilin Niu, MD      . divalproex (DEPAKOTE SPRINKLE) capsule 250 mg  250 mg Oral Q8H Mir Vergie LivingMohammed Ikramullah, MD   250 mg at 05/19/16 [id number]  . donepezil (ARICEPT) tablet 10 mg  10 mg Oral Daily Lorretta HarpXilin Niu, MD   10 mg at 05/19/16 1020  . enoxaparin (LOVENOX) injection 40 mg  40 mg Subcutaneous Q24H Lorretta HarpXilin Niu, MD   40 mg at 05/19/16 1020  . estrogens (conjugated) (PREMARIN) tablet 0.625 mg  0.625 mg Oral Daily Lorretta HarpXilin Niu, MD   0.625 mg at 05/19/16 1020  . haloperidol (HALDOL) tablet 1 mg  1 mg Oral BID PRN Lorretta HarpXilin Niu, MD      . ketotifen (ZADITOR) 0.025 % ophthalmic solution 1 drop  1 drop Both Eyes Daily Lorretta HarpXilin Niu, MD   1 drop  at 05/19/16 1021  . memantine (NAMENDA) tablet 10 mg  10 mg Oral BID Lorretta Harp, MD   10 mg at 05/19/16 1020  . piperacillin-tazobactam (ZOSYN) IVPB 3.375 g  3.375 g Intravenous Q8H Lorretta Harp, MD   3.375 g at 05/19/16 0746  . potassium chloride SA (K-DUR,KLOR-CON) CR tablet 40 mEq  40 mEq Oral TID Mir Vergie Living, MD   40 mEq at 05/19/16 1020  . QUEtiapine (SEROQUEL) tablet 50 mg  50 mg Oral TID Lorretta Harp, MD   50 mg at 05/19/16 1020  . vancomycin (VANCOCIN) IVPB 1000 mg/200 mL premix  1,000 mg Intravenous Q12H Maryanna Shape Runyon, RPH   1,000 mg at 05/19/16 0144  . Vilazodone HCl (VIIBRYD) TABS 40 mg  40 mg Oral Daily Lorretta Harp, MD   40 mg at 05/19/16 1020  . zolpidem (AMBIEN)  tablet 5 mg  5 mg Oral QHS Lorretta Harp, MD   5 mg at 05/18/16 2132     Discharge Medications: Please see discharge summary for a list of discharge medications.  Relevant Imaging Results:  Relevant Lab Results:   Additional Information ss#[id number]   Please Continue Lorazepam Cream 1/MG/ML listed on Discharge Summary.   Please Discontinue the following:  LEVONOX VANCOCIN ZOSYN  Clearance Coots, LCSW

## 2016-05-20 ENCOUNTER — Encounter (HOSPITAL_COMMUNITY): Payer: Self-pay

## 2016-05-20 LAB — CBC
HEMATOCRIT: 39.1 % (ref 36.0–46.0)
HEMOGLOBIN: 12.3 g/dL (ref 12.0–15.0)
MCH: 28.9 pg (ref 26.0–34.0)
MCHC: 31.5 g/dL (ref 30.0–36.0)
MCV: 91.8 fL (ref 78.0–100.0)
Platelets: 186 10*3/uL (ref 150–400)
RBC: 4.26 MIL/uL (ref 3.87–5.11)
RDW: 14.3 % (ref 11.5–15.5)
WBC: 4.9 10*3/uL (ref 4.0–10.5)

## 2016-05-20 LAB — BASIC METABOLIC PANEL
ANION GAP: 8 (ref 5–15)
BUN: 7 mg/dL (ref 6–20)
CO2: 29 mmol/L (ref 22–32)
Calcium: 8.9 mg/dL (ref 8.9–10.3)
Chloride: 106 mmol/L (ref 101–111)
Creatinine, Ser: 1.04 mg/dL — ABNORMAL HIGH (ref 0.44–1.00)
GFR calc Af Amer: 57 mL/min — ABNORMAL LOW (ref >60)
GFR, EST NON AFRICAN AMERICAN: 49 mL/min — AB (ref >60)
Glucose, Bld: 89 mg/dL (ref 65–99)
POTASSIUM: 4.3 mmol/L (ref 3.5–5.1)
SODIUM: 143 mmol/L (ref 135–145)

## 2016-05-20 MED ORDER — AMOXICILLIN-POT CLAVULANATE 875-125 MG PO TABS: 1.0000 | ORAL_TABLET | Freq: Two times a day (BID) | ORAL | 0 refills | Status: AC

## 2016-05-20 MED ORDER — SACCHAROMYCES BOULARDII 250 MG PO CAPS: 250.0000 mg | ORAL_CAPSULE | Freq: Two times a day (BID) | ORAL | 0 refills | Status: AC

## 2016-05-20 NOTE — Progress Notes (Signed)
LCSWA faxed information to [facility] via EPIC to [phone]

## 2016-05-20 NOTE — Progress Notes (Signed)
[facility]ans[facility] stated that the FL-2 needed to include the d/c of albuterol. He stated he didn't understand why she couldn't get this from the d/c summary/AVS. I stated that this is the only facility that used the FL2 instead of the d/c summary/AVS. I asked him to please call Gardiner B[facility]Kindra Humble at [phone] to d/c albuterol. He stated to text him the number and he would call.

## 2016-05-20 NOTE — Discharge Summary (Signed)
Discharge Summary  Pamela Cummings ZOX:[id number] DOB: 1931-09-11  PCP: Minda Meo, MD  Admit date: 05/15/2016 Discharge date: 05/20/2016   Recommendations for Outpatient Follow-up:  1. PCP 1-2 weeks   Discharge Diagnoses:  Active Hospital Problems   Diagnosis Date Noted  . HCAP (healthcare-associated pneumonia) 05/16/2016  . Sepsis (HCC) 05/16/2016  . Depression 05/16/2016  . Hypokalemia 05/16/2016  . Dementia with behavioral disturbance 02/27/2015    Resolved Hospital Problems   Diagnosis Date Noted Date Resolved  No resolved problems to display.    Discharge Condition: Stable   Diet recommendation: Heart Healthy   Vitals:   05/19/16 2107 05/20/16 0528  BP: (!) 161/53 (!) 167/97  Pulse: (!) 57 (!) 56  Resp: 19 18  Temp: 98.7 F (37.1 C) 97.7 F (36.5 C)    History of present illness and Hospital Course:  82 y.o.femalewith medical history significant of dementia with behavior disturbance, wheelchair-bound, depression, Bell's palsy, who presents with lethargy,fever, cough, shortness breath. She was diagnosed with HCAP as well as sepsis, treated with IV fluids, IV antibiotics. She had a positive blood culture for which a repeat was checked. Antibiotics were narrowed to Unasyn and Vancomycin since pseudomonas was not suspected and aspiration could also not be ruled out. She has been doing well, was on low amounts of supplemental oxygen. She has remained afebrile, mental status and functioning have been at her baseline. She is less confused today, not symptomatic, and I discussed with two of her daughters as well as her husband this morning at the bedside and they are in agreement with plans for discharge today.  Assessment/Plan: Sepsis possibly due to HCAP: Patient is septic with elevated lactate at the time of admission, fever and tachypnea. Currently hemodynamically stable. Likely due to HCAP. Aspiration pneumonia cannot be completely ruled out. Bacteremia was  suspected due to positive blood culture in 1/2 bottles and repeat cultures were drawn. Patient had no recurrent fevers and and no murmur on exam so Echo was not performed. Repeat blood cultures are negative x3 days as of this writing. As the patient has done well on UnaSyn, she will be discharged on Augmentin today to complete a total 10 day course for presumed aspiration PNA. MRSA screen was also negative so I doubt MRSA coverage is necessary.   Dementia with behavioral disturbance: - she returned to baseline quickly and has been doing well -on Depakote, donepezil, Namenda  -Haldol prn   Depression:Stable, no suicidal or homicidal ideations. -Continue home medications: Vilazodone and Seroquel  HTN:not on meds at home. bp normal -monitor Bp   Hypokalemia:  - repleted orally and normal today  DVT Prophylaxis: Lovenox  Code Status: DNR   Discharge Exam: BP (!) 167/97 (BP Location: Left Arm)   Pulse (!) 56   Temp 97.7 F (36.5 C) (Axillary)   Resp 18   Ht 5\' 7"  (1.702 m)   Wt 81.6 kg (180 lb)   SpO2 93%   BMI 28.19 kg/m  General:  Alert, oriented, calm, in no acute distress  Eyes: pupils round and reactive to light and accomodation, clear sclerea Neck: supple, no masses, trachea mildline  Cardiovascular: RRR, no murmurs or rubs, no peripheral edema  Respiratory: clear to auscultation bilaterally, no wheezes, no crackles  Abdomen: soft, nontender, nondistended, normal bowel tones heard  Skin: dry, no rashes  Musculoskeletal: no joint effusions, normal range of motion  Psychiatric: appropriate affect, normal speech  Neurologic: extraocular muscles intact, clear speech, moving all extremities with intact sensorium  Discharge Instructions You were cared for by a hospitalist during your hospital stay. If you have any questions about your discharge medications or the care you received while you were in the hospital after you are discharged, you can call the unit and  asked to speak with the hospitalist on call if the hospitalist that took care of you is not available. Once you are discharged, your primary care physician will handle any further medical issues. Please note that NO REFILLS for any discharge medications will be authorized once you are discharged, as it is imperative that you return to your primary care physician (or establish a relationship with a primary care physician if you do not have one) for your aftercare needs so that they can reassess your need for medications and monitor your lab values.  Discharge Instructions    Call MD for:  extreme fatigue    Complete by:  As directed    Call MD for:  persistant dizziness or light-headedness    Complete by:  As directed    Call MD for:  temperature >100.4    Complete by:  As directed    Diet - low sodium heart healthy    Complete by:  As directed    Increase activity slowly    Complete by:  As directed        Medication List    TAKE these medications   acetaminophen 500 MG tablet Commonly known as:  TYLENOL Take 500 mg by mouth 3 (three) times daily.   amoxicillin-clavulanate 875-125 MG tablet Commonly known as:  AUGMENTIN Take 1 tablet by mouth 2 (two) times daily.   Cranberry 400 MG Tabs Take 1 tablet by mouth daily.   divalproex 250 MG DR tablet Commonly known as:  DEPAKOTE Take 250 mg by mouth 3 (three) times daily.   donepezil 10 MG tablet Commonly known as:  ARICEPT Take 10 mg by mouth daily.   estrogens (conjugated) 0.625 MG tablet Commonly known as:  PREMARIN Take 0.625 mg by mouth daily.   haloperidol 1 MG tablet Commonly known as:  HALDOL Take 1 mg by mouth 2 (two) times daily as needed for agitation.   ketotifen 0.025 % ophthalmic solution Commonly known as:  ZADITOR Place 1 drop into both eyes daily.   memantine 10 MG tablet Commonly known as:  NAMENDA Take 10 mg by mouth 2 (two) times daily.   PRESCRIPTION MEDICATION Apply 1 application topically every  8 (eight) hours as needed (FOR AGITATION). LORAZEPAM CREAM 1 MG/ML   promethazine 25 MG tablet Commonly known as:  PHENERGAN Take 0.5 tablets (12.5 mg total) by mouth every 6 (six) hours as needed for nausea or vomiting.   QUEtiapine 50 MG tablet Commonly known as:  SEROQUEL Take 50 mg by mouth 3 (three) times daily.   saccharomyces boulardii 250 MG capsule Commonly known as:  FLORASTOR Take 1 capsule (250 mg total) by mouth 2 (two) times daily.   VIIBRYD 40 MG Tabs Generic drug:  Vilazodone HCl Take 40 mg by mouth daily.   zolpidem 10 MG tablet Commonly known as:  AMBIEN Take 10 mg by mouth at bedtime.      Allergies  Allergen Reactions  . Demerol [Meperidine]     NOTED ON MAR  . Sulfa Antibiotics     NOTED ON MAR   Follow-up Information    ARONSON,RICHARD A, MD Follow up in 2 week(s).   Specialty:  Internal Medicine Contact information: [address] [place] [id number]  [phone]            The results of significant diagnostics from this hospitalization (including imaging, microbiology, ancillary and laboratory) are listed below for reference.    Significant Diagnostic Studies: Dg Chest 2 View  Result Date: 05/16/2016 CLINICAL DATA:  Initial evaluation for acute sepsis. EXAM: CHEST  2 VIEW COMPARISON:  Prior radiograph from 09/30/2015. FINDINGS: Patient is rotated to the right. Cardiac and mediastinal silhouettes are stable in size and contour, and remain within normal limits. Lungs are hypoinflated. Patchy bibasilar and perihilar opacities, which may reflect atelectasis and/ or infiltrates. Additionally, asymmetric opacity within the right upper lobe, which may reflect additional infiltrate. No pulmonary edema or pleural effusion. No pneumothorax. No acute osseus abnormality. Multilevel degenerative spondylolysis noted within the visualized spine. IMPRESSION: 1. Shallow lung inflation with patchy perihilar and bibasilar airspace opacities. While these  findings may in part reflect atelectasis, possible infiltrates should be considered given the provided history of sepsis. 2. Additional asymmetric hazy opacity within the right upper lobe, also concerning for possible infection. Electronically Signed   By: Rise Mu M.D.   On: 05/16/2016 02:57    Microbiology: Recent Results (from the past 240 hour(s))  Blood Culture (routine x 2)     Status: Abnormal   Collection Time: 05/16/16 12:17 AM  Result Value Ref Range Status   Specimen Description BLOOD RIGHT ANTECUBITAL  Final   Special Requests BOTTLES DRAWN AEROBIC AND ANAEROBIC 5CC EACH  Final   Culture  Setup Time   Final    GRAM POSITIVE COCCI IN CLUSTERS IN BOTH AEROBIC AND ANAEROBIC BOTTLES CRITICAL RESULT CALLED TO, READ BACK BY AND VERIFIED WITH: A RUNYON,PHARMD AT 0708 05/17/16 BY L BENFIELD    Culture (A)  Final    STAPHYLOCOCCUS SPECIES (COAGULASE NEGATIVE) VIRIDANS STREPTOCOCCUS THE SIGNIFICANCE OF ISOLATING THIS ORGANISM FROM A SINGLE SET OF BLOOD CULTURES WHEN MULTIPLE SETS ARE DRAWN IS UNCERTAIN. PLEASE NOTIFY THE MICROBIOLOGY DEPARTMENT WITHIN ONE WEEK IF SPECIATION AND SENSITIVITIES ARE REQUIRED. Performed at [facility]    Report Status 05/19/2016 FINAL  Final  Blood Culture ID Panel (Reflexed)     Status: Abnormal   Collection Time: 05/16/16 12:17 AM  Result Value Ref Range Status   Enterococcus species NOT DETECTED NOT DETECTED Final   Listeria monocytogenes NOT DETECTED NOT DETECTED Final   Staphylococcus species DETECTED (A) NOT DETECTED Final    Comment: CRITICAL RESULT CALLED TO, READ BACK BY AND VERIFIED WITH: A RUNYON,PHARMD AT 0708 05/17/16 BY L BENFIELD    Staphylococcus aureus NOT DETECTED NOT DETECTED Final   Methicillin resistance DETECTED (A) NOT DETECTED Final    Comment: CRITICAL RESULT CALLED TO, READ BACK BY AND VERIFIED WITH: A RUNYON,PHARMD AT 0708 05/17/16 BY L BENFIELD    Streptococcus species DETECTED (A) NOT DETECTED Final     Comment: CRITICAL RESULT CALLED TO, READ BACK BY AND VERIFIED WITH: A RUNYON,PHARMD AT 0708 05/17/16 BY L BENFIELD    Streptococcus agalactiae NOT DETECTED NOT DETECTED Final   Streptococcus pneumoniae NOT DETECTED NOT DETECTED Final   Streptococcus pyogenes NOT DETECTED NOT DETECTED Final   Acinetobacter baumannii NOT DETECTED NOT DETECTED Final   Enterobacteriaceae species NOT DETECTED NOT DETECTED Final   Enterobacter cloacae complex NOT DETECTED NOT DETECTED Final   Escherichia coli NOT DETECTED NOT DETECTED Final   Klebsiella oxytoca NOT DETECTED NOT DETECTED Final   Klebsiella pneumoniae NOT DETECTED NOT DETECTED Final   Proteus species NOT DETECTED NOT DETECTED Final  Serratia marcescens NOT DETECTED NOT DETECTED Final   Haemophilus influenzae NOT DETECTED NOT DETECTED Final   Neisseria meningitidis NOT DETECTED NOT DETECTED Final   Pseudomonas aeruginosa NOT DETECTED NOT DETECTED Final   Candida albicans NOT DETECTED NOT DETECTED Final   Candida glabrata NOT DETECTED NOT DETECTED Final   Candida krusei NOT DETECTED NOT DETECTED Final   Candida parapsilosis NOT DETECTED NOT DETECTED Final   Candida tropicalis NOT DETECTED NOT DETECTED Final    Comment: Performed at [facility]  Urine culture     Status: Abnormal   Collection Time: 05/16/16 12:31 AM  Result Value Ref Range Status   Specimen Description URINE, RANDOM  Final   Special Requests NONE  Final   Culture MULTIPLE SPECIES PRESENT, SUGGEST RECOLLECTION (A)  Final   Report Status 05/17/2016 FINAL  Final  Blood Culture (routine x 2)     Status: None (Preliminary result)   Collection Time: 05/16/16 12:45 AM  Result Value Ref Range Status   Specimen Description BLOOD RIGHT HAND  Final   Special Requests IN PEDIATRIC BOTTLE 4CC  Final   Culture   Final    NO GROWTH 3 DAYS Performed at [facility]    Report Status PENDING  Incomplete  Respiratory Panel by PCR     Status: None   Collection Time: 05/16/16   5:16 AM  Result Value Ref Range Status   Adenovirus NOT DETECTED NOT DETECTED Final   Coronavirus 229E NOT DETECTED NOT DETECTED Final   Coronavirus HKU1 NOT DETECTED NOT DETECTED Final   Coronavirus NL63 NOT DETECTED NOT DETECTED Final   Coronavirus OC43 NOT DETECTED NOT DETECTED Final   Metapneumovirus NOT DETECTED NOT DETECTED Final   Rhinovirus / Enterovirus NOT DETECTED NOT DETECTED Final   Influenza A NOT DETECTED NOT DETECTED Final   Influenza B NOT DETECTED NOT DETECTED Final   Parainfluenza Virus 1 NOT DETECTED NOT DETECTED Final   Parainfluenza Virus 2 NOT DETECTED NOT DETECTED Final   Parainfluenza Virus 3 NOT DETECTED NOT DETECTED Final   Parainfluenza Virus 4 NOT DETECTED NOT DETECTED Final   Respiratory Syncytial Virus NOT DETECTED NOT DETECTED Final   Bordetella pertussis NOT DETECTED NOT DETECTED Final   Chlamydophila pneumoniae NOT DETECTED NOT DETECTED Final   Mycoplasma pneumoniae NOT DETECTED NOT DETECTED Final    Comment: Performed at [facility]  MRSA PCR Screening     Status: None   Collection Time: 05/16/16  5:28 AM  Result Value Ref Range Status   MRSA by PCR NEGATIVE NEGATIVE Final    Comment:        The GeneXpert MRSA Assay (FDA approved for NASAL specimens only), is one component of a comprehensive MRSA colonization surveillance program. It is not intended to diagnose MRSA infection nor to guide or monitor treatment for MRSA infections.   Culture, blood (routine x 2)     Status: None (Preliminary result)   Collection Time: 05/17/16  8:20 AM  Result Value Ref Range Status   Specimen Description BLOOD RIGHT ANTECUBITAL  Final   Special Requests NONE  Final   Culture   Final    NO GROWTH 2 DAYS Performed at [facility]    Report Status PENDING  Incomplete  Culture, blood (routine x 2)     Status: None (Preliminary result)   Collection Time: 05/17/16  8:25 AM  Result Value Ref Range Status   Specimen Description BLOOD RIGHT HAND   Final   Special  Requests NONE  Final   Culture   Final    NO GROWTH 2 DAYS Performed at [facility]    Report Status PENDING  Incomplete     Labs: Basic Metabolic Panel:  Recent Labs Lab 05/16/16 0418 05/17/16 0613 05/18/16 0524 05/19/16 0519 05/20/16 0534  NA 141 141 142 143 143  K 3.3* 3.2* 3.3* 4.4 4.3  CL 108 108 106 109 106  CO2 26 25 28 26 29   GLUCOSE 107* 90 87 84 89  BUN 15 8 6 7 7   CREATININE 0.79 0.84 0.89 0.97 1.04*  CALCIUM 7.8* 8.0* 8.0* 8.5* 8.9   Liver Function Tests:  Recent Labs Lab 05/16/16 0023  AST 16  ALT 11*  ALKPHOS 64  BILITOT 0.5  PROT 6.9  ALBUMIN 3.7   No results for input(s): LIPASE, AMYLASE in the last 168 hours. No results for input(s): AMMONIA in the last 168 hours. CBC:  Recent Labs Lab 05/16/16 0051 05/16/16 0418 05/17/16 0613 05/18/16 0524 05/19/16 0519 05/20/16 0534  WBC 4.3 4.7 6.1 3.9* 5.5 4.9  NEUTROABS 3.7  --   --   --   --   --   HGB 13.8 11.1* 11.6* 11.2* 12.3 12.3  HCT 39.8 34.3* 36.8 35.0* 39.1 39.1  MCV 88.6 91.2 90.0 89.3 92.2 91.8  PLT PLATELET CLUMPS NOTED ON SMEAR, UNABLE TO ESTIMATE 122* 137* 151 171 186   Cardiac Enzymes: No results for input(s): CKTOTAL, CKMB, CKMBINDEX, TROPONINI in the last 168 hours. BNP: BNP (last 3 results) No results for input(s): BNP in the last 8760 hours.  ProBNP (last 3 results) No results for input(s): PROBNP in the last 8760 hours.  CBG: No results for input(s): GLUCAP in the last 168 hours.  Time spent: 35 minutes were spent in preparing this discharge including medication reconciliation, counseling, and coordination of care.  Signed:  Mir Vergie Living  [facility] 05/20/2016, 8:47 AM

## 2016-05-20 NOTE — Progress Notes (Addendum)
Gave report to MarathonPansy, Charity fundraiserN [facility]. Left number in case she had additional questions.

## 2016-05-20 NOTE — Progress Notes (Signed)
The facility director Enrique SackKendra and [facility] reports they cannot take the patient until [facility] and [facility]. LCSWA explained to facility they needed to go by the discharge summary and not the FL2, the medications listed are the patients current medications while in the hospital. They reported the facility goes [facility] the [facility] not the d/c summary because it is not signed byt the MD.  LCSWA contacted CSW Engineer, siteAssistant Driector and explained ALF need for updated FL2 with specific medications listed. CSW ChiropodistAssistant Director gave CSW permission to write in additional information but again clarified "Listed on Discharge Summary".  LCSWA contacted Kirby CriglerIkramullah, MD and explained the facility needed the FL2 to say "Lovenox, Vancocin and Zosyn" are to be discontinued. MD, gave LCSWA permission to write it on FL2 under additional information.   CSW made corrections for diet to say "No added Salt". Facility [place] informed.

## 2016-05-20 NOTE — Progress Notes (Signed)
Nurse given Report #

## 2016-05-20 NOTE — Clinical Social Work Placement (Signed)
   CLINICAL SOCIAL WORK PLACEMENT  NOTE  Date:  05/20/2016  Patient Details  Name: Pamela Cummings MRN: [id number] Date of Birth: Jan 14, 1932  Clinical Social Work is seeking post-discharge placement for this patient at the Assisted Living Facility level of care (*CSW will initial, date and re-position this form in  chart as items are completed):  No   Patient/family provided with [place] Clinical Social Work Department's list of facilities offering this level of care within the geographic area requested by the patient (or if unable, by the patient's family).  No   Patient/family informed of their freedom to choose among providers that offer the needed level of care, that participate in Medicare, Medicaid or managed care program needed by the patient, have an available bed and are willing to accept the patient.  No   Patient/family informed of [facility] ownership interest [facility] and [facility], as well as of the fact that they are under no obligation to receive care at these facilities.  PASRR submitted to EDS on       PASRR number received on       Existing PASRR number confirmed on       FL2 transmitted to all facilities in geographic area requested by pt/family on       FL2 transmitted to all facilities within larger geographic area on       Patient informed that his/her managed care company has contracts with or will negotiate with certain facilities, including the following:  [facility]         Patient/family informed of bed offers received.  [facility]     Physician recommends and patient chooses bed at      [facility].  Patient to be transferred to facility by [facility]     Patient family notified on 05/20/16 of transfer.  Name of family member notified:  Beth     PHYSICIAN       Additional Comment:     _______________________________________________ Clearance CootsNicole A Joell Usman, LCSW 05/20/2016, 11:58 AM

## 2016-05-21 LAB — CULTURE, BLOOD (ROUTINE X 2): CULTURE: NO GROWTH

## 2016-05-22 LAB — CULTURE, BLOOD (ROUTINE X 2)
CULTURE: NO GROWTH
CULTURE: NO GROWTH

## 2016-09-25 DEATH — deceased

## 2017-10-04 IMAGING — CR DG CHEST 2V
2 series · 2 of 2 positions shown · non-contrast
Comparison: 02/27/2015

CLINICAL DATA: Unwitnessed fall tonight.  Initial encounter.

EXAM:
CHEST  2 VIEW

[w chest lat]
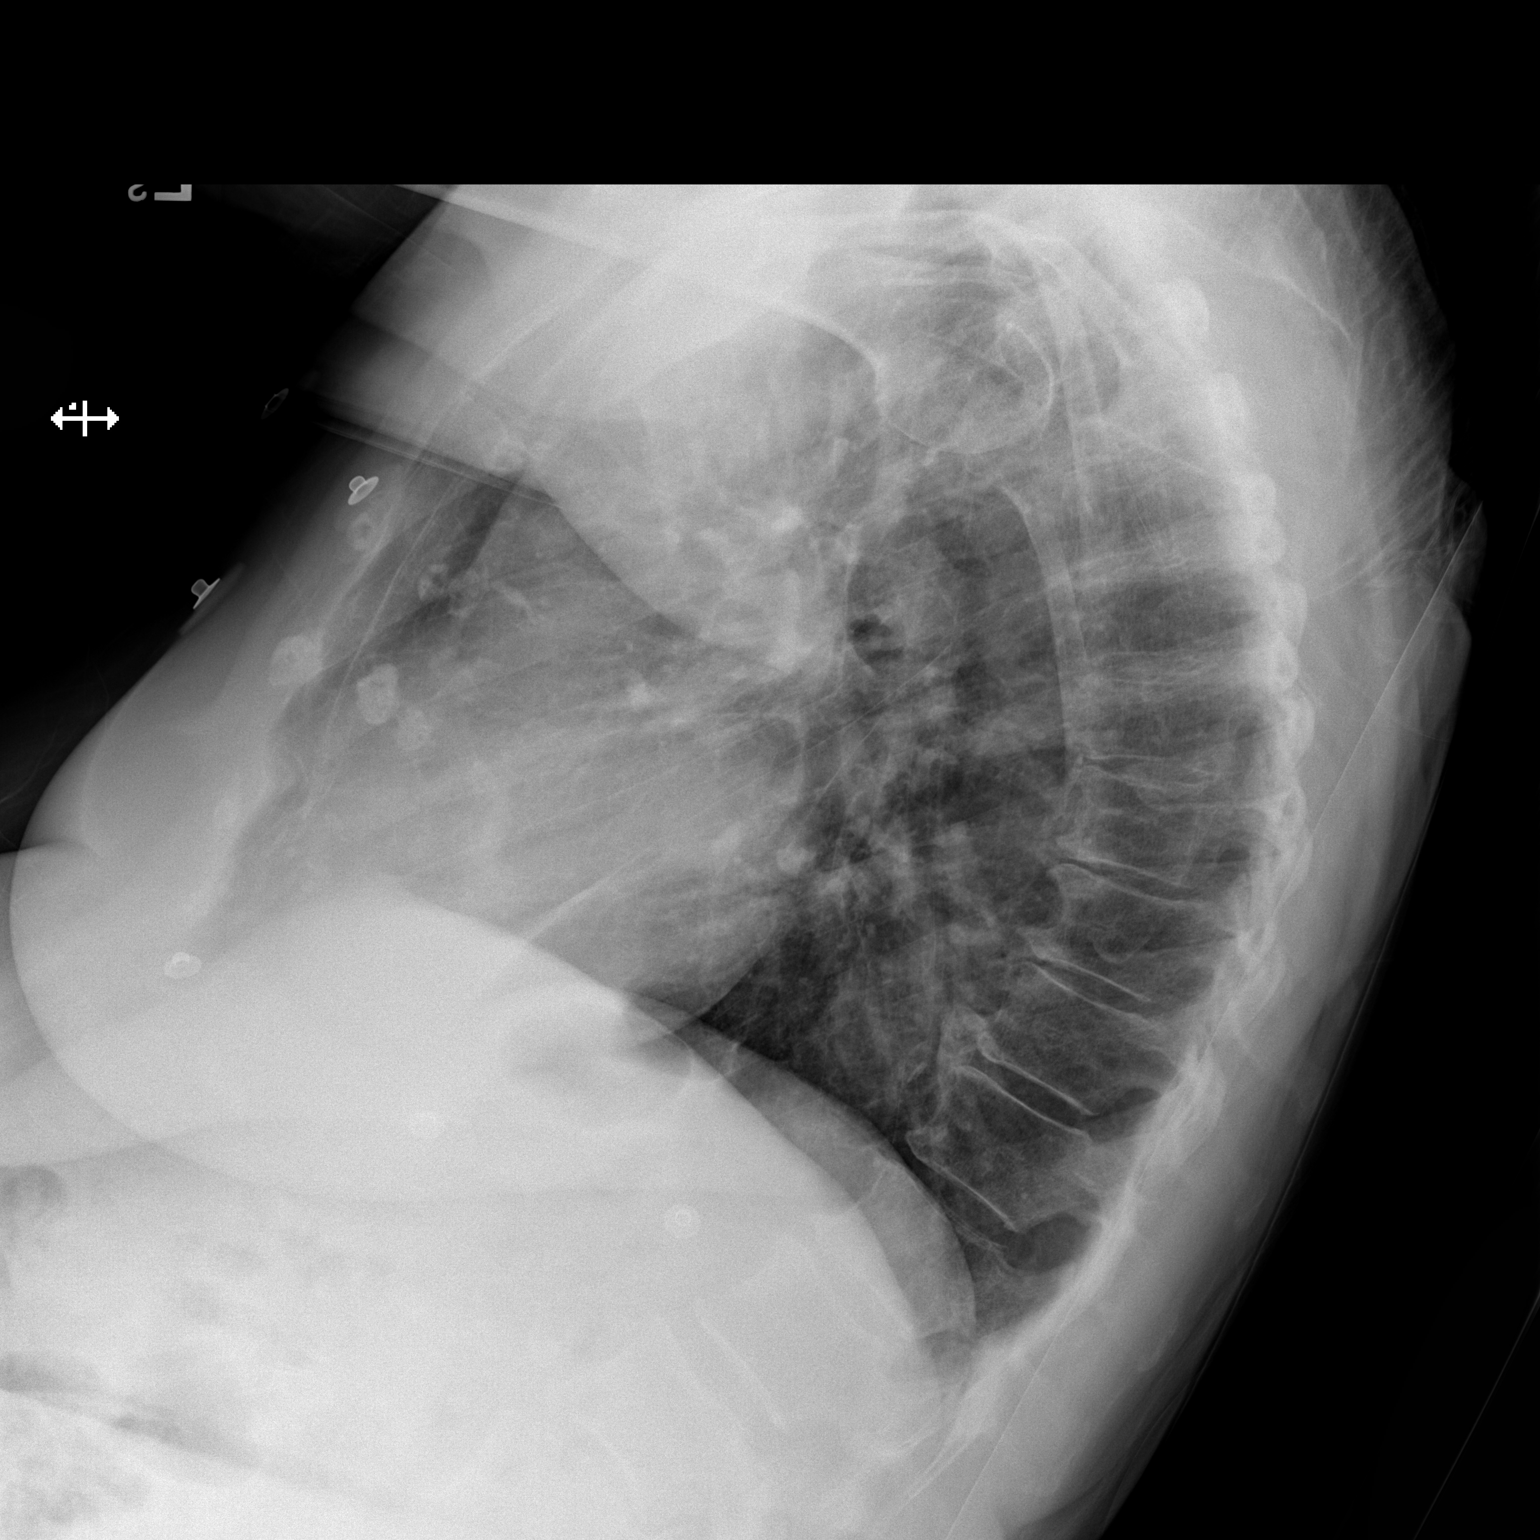

[x chest ap]
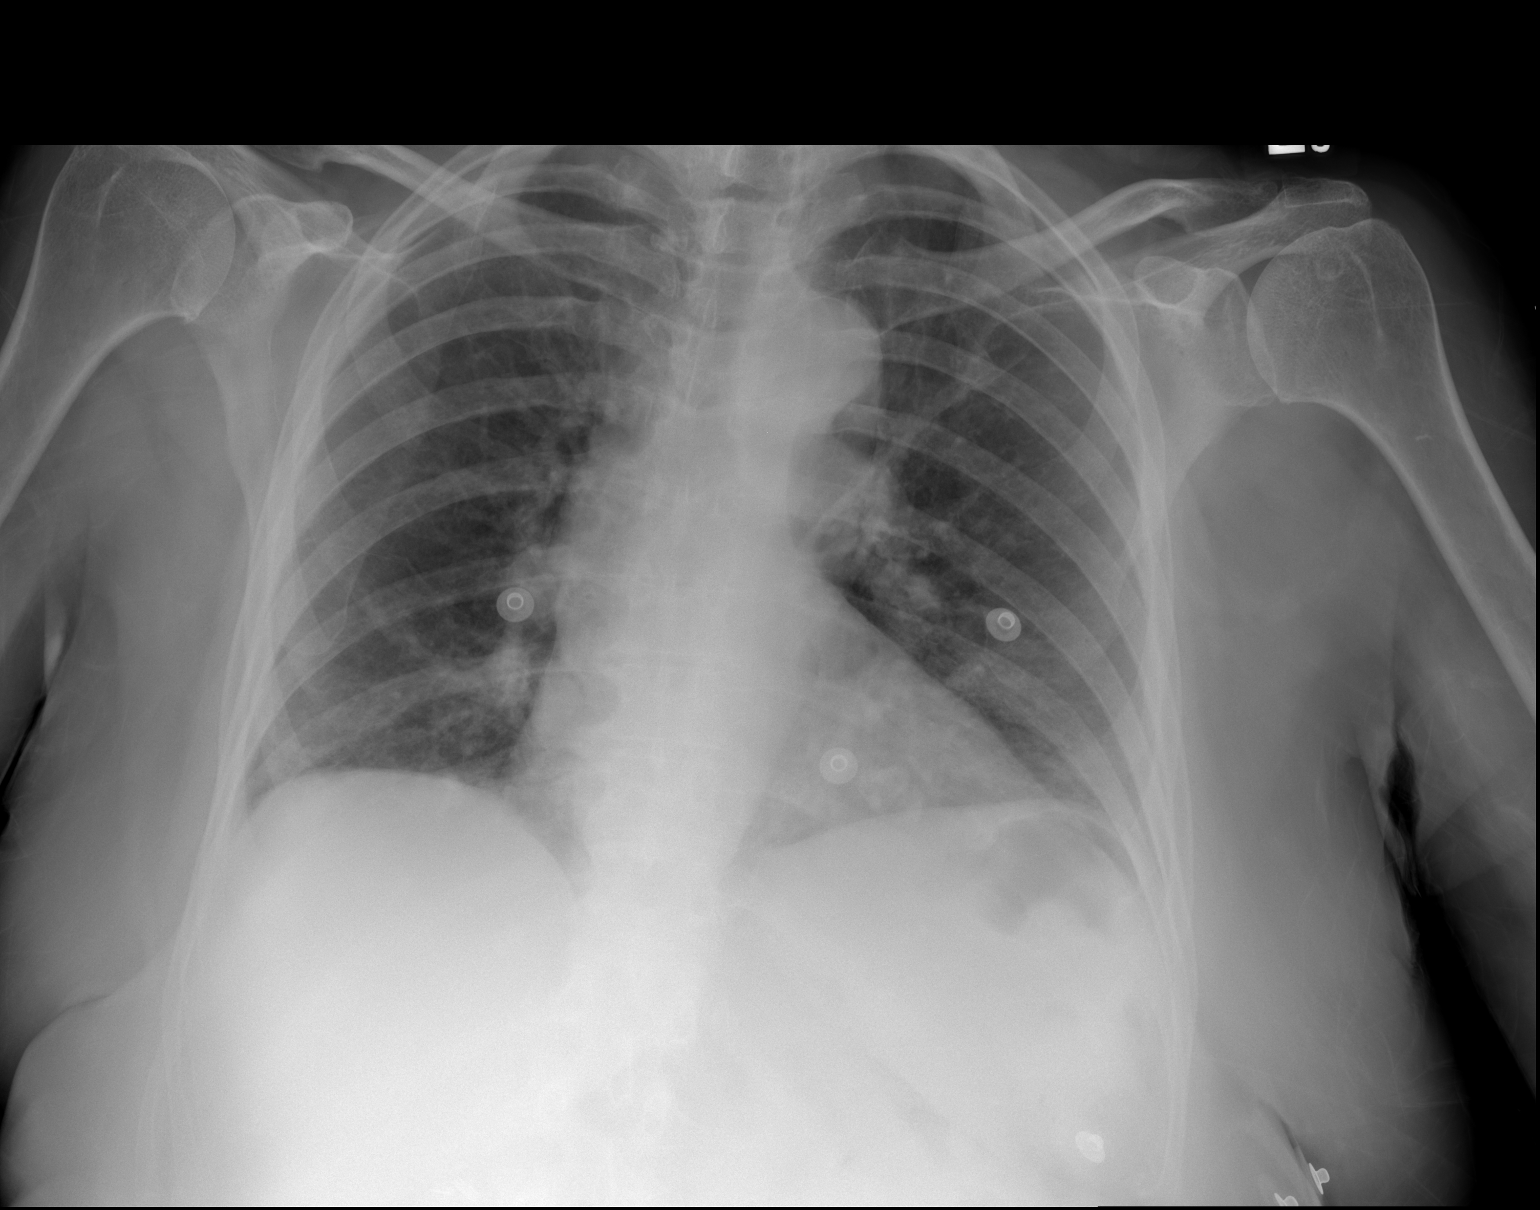

[2 of 2 positions shown; findings below may reference images not displayed]

FINDINGS: Mild left perihilar atelectasis. There is no edema, consolidation,
effusion, or pneumothorax. Normal heart size and stable aortic
contours. No visible fracture.
IMPRESSION: No active cardiopulmonary disease.

## 2017-10-04 IMAGING — CT CT HEAD W/O CM
4 of 6 series · 17 of 47 positions shown, 18 images · non-contrast
Comparison: CT head May 21, 2015

CLINICAL DATA: Two unwitnessed falls tonight. No loss of
consciousness, not on blood thinners. History of dementia,
hypertension.

EXAM:
CT HEAD WITHOUT CONTRAST
CT CERVICAL SPINE WITHOUT CONTRAST
TECHNIQUE: Multidetector CT imaging of the head and cervical spine was
performed following the standard protocol without intravenous
contrast. Multiplanar CT image reconstructions of the cervical spine
were also generated.

[Series 3: head w/o · axial · non-contrast · 0.43mm/px · z∈[-153,-73]mm · 4 of 28 slices shown, 5 images]
[im 6/28  brain]
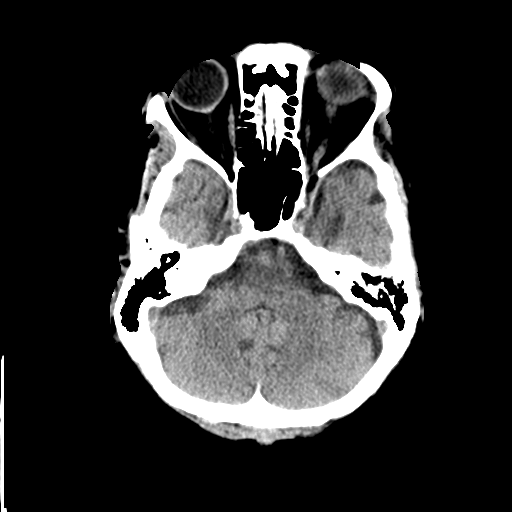
[im 6/28  bone]
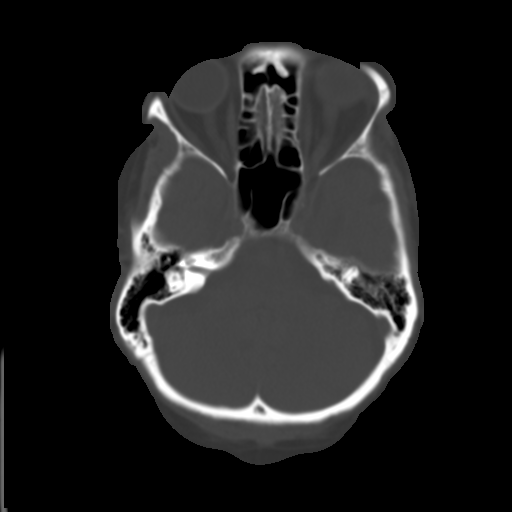
[im 11/28  brain]
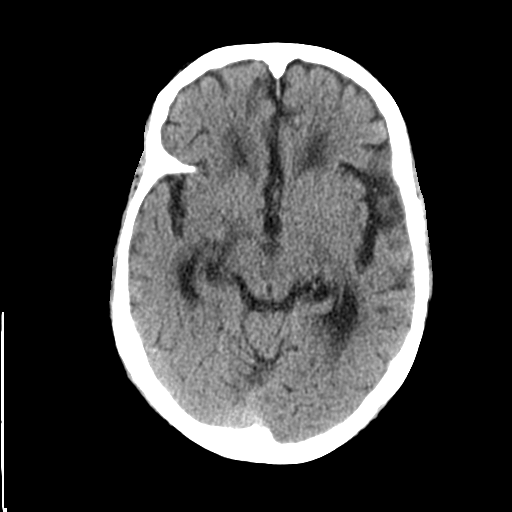
[im 17/28  brain]
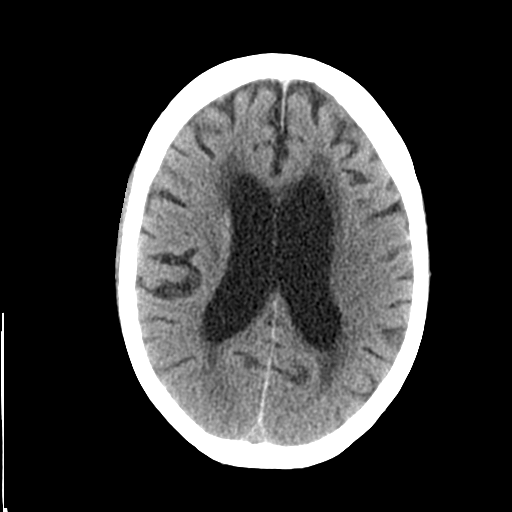
[im 22/28  brain]
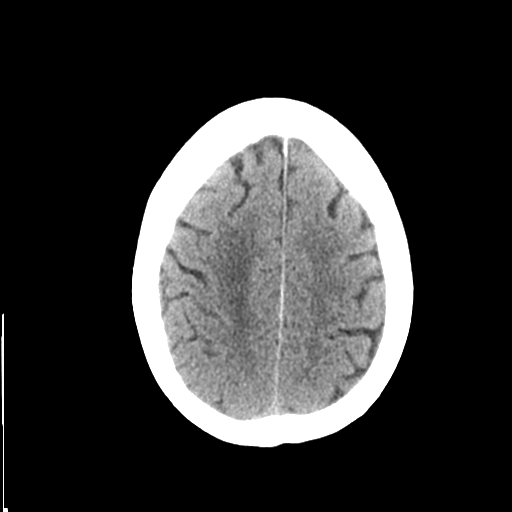

[Series 4: bone windows · axial · 0.43mm/px · z∈[-163,-58]mm · 7 of 47 slices shown]
[im 6/47  bone]
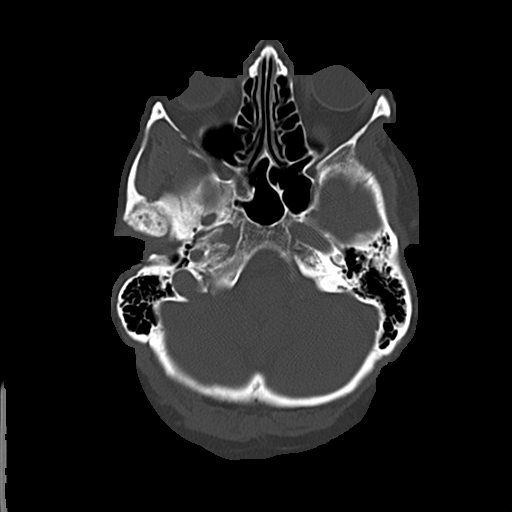
[im 12/47  bone]
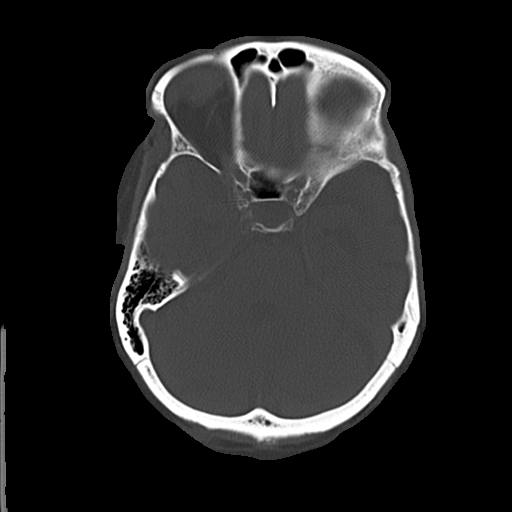
[im 18/47  bone]
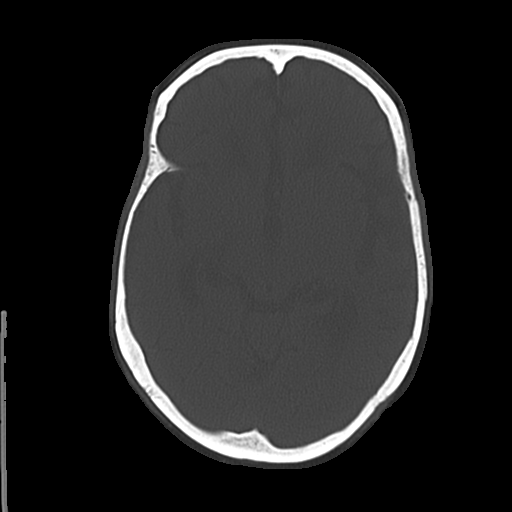
[im 24/47  bone]
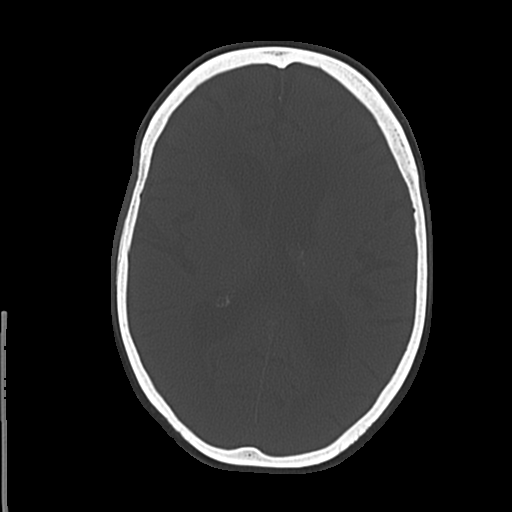
[im 29/47  bone]
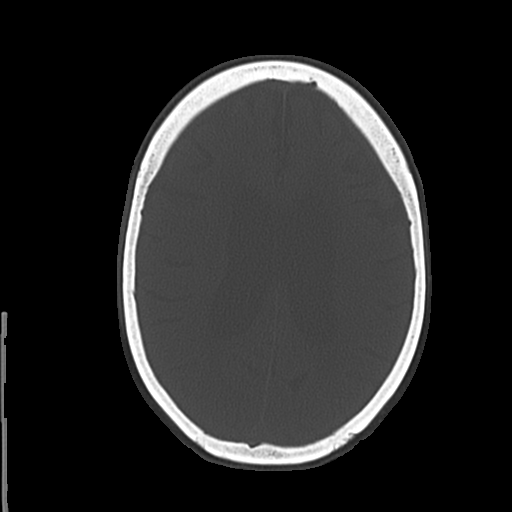
[im 35/47  bone]
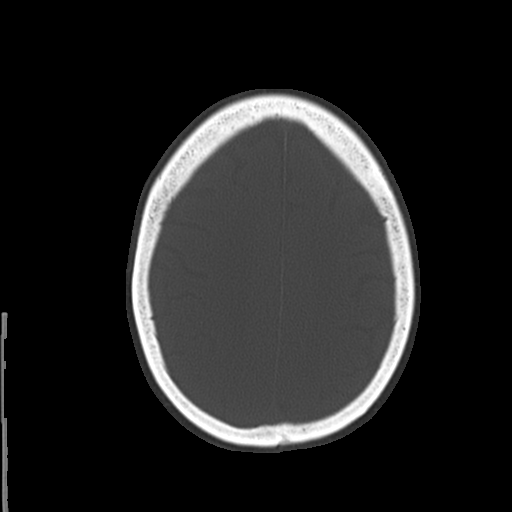
[im 41/47  bone]
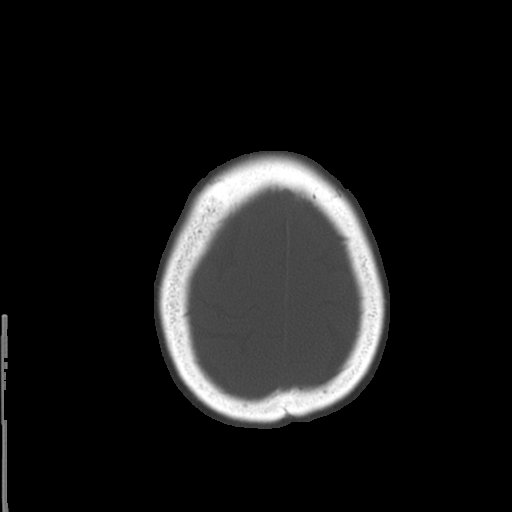

[Series 8: axial recon · coronal · 0.23mm/px · 3 of 83 slices shown]
[im 49/83  brain]
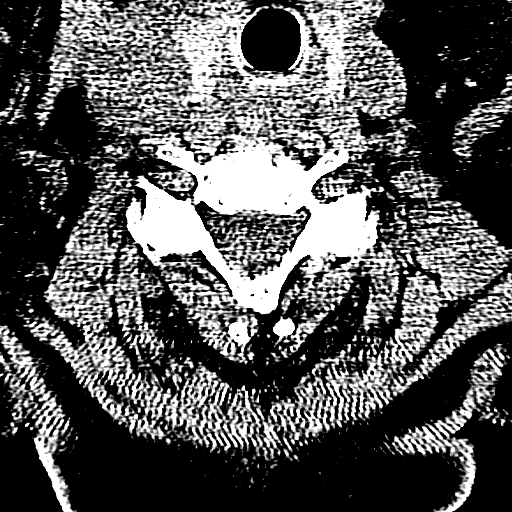
[im 57/83  brain]
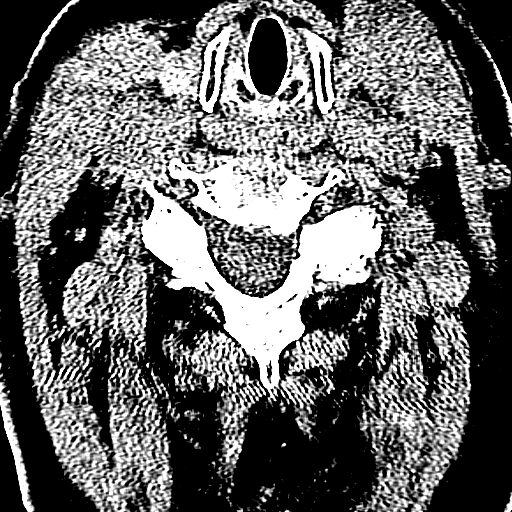
[im 66/83  brain]
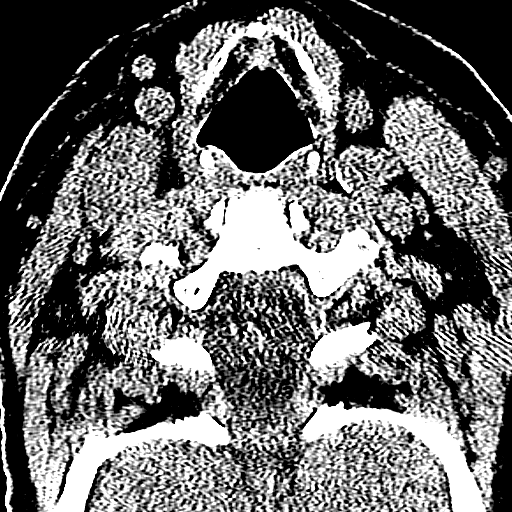

[Series 10: sagittal · sagittal · 0.28mm/px · 3 of 39 slices shown]
[im 13/39  brain]
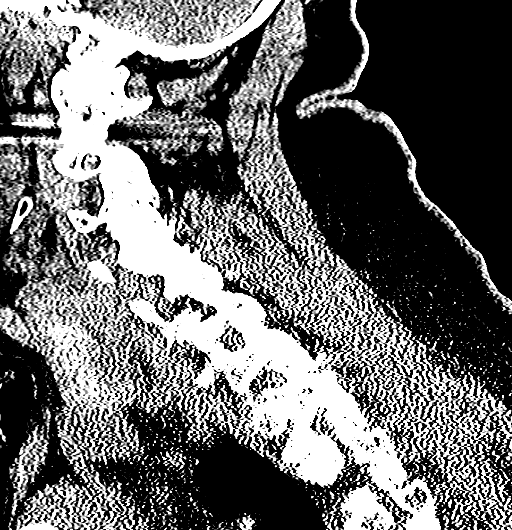
[im 20/39  brain]
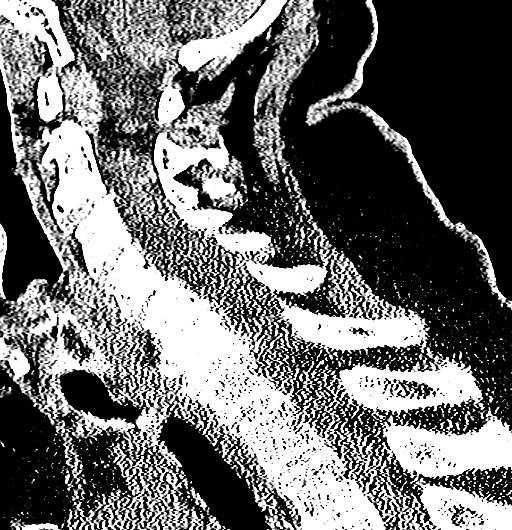
[im 26/39  brain]
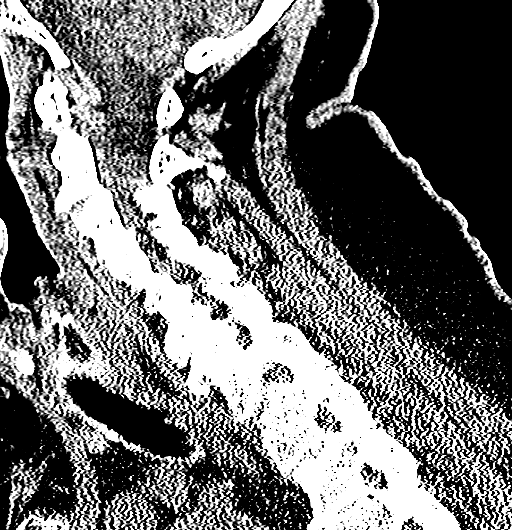

[17 of 47 positions shown; findings below may reference images not displayed]

FINDINGS: CT HEAD FINDINGS

Moderate to severe ventriculomegaly on the basis of global
parenchymal brain volume loss, stable from prior imaging. No
Intraparenchymal hemorrhage, mass effect nor midline shift. Patchy
supratentorial white matter hypodensities are within normal range
for patient's age and though non-specific suggest sequelae of
chronic small vessel ischemic disease. No acute large vascular
territory infarcts.

No abnormal extra-axial fluid collections. Basal cisterns are
patent. Mild calcific atherosclerosis of the carotid siphons.

No skull fracture. The included ocular globes and orbital contents
are non-suspicious. The mastoid aircells and included paranasal
sinuses are well-aerated. Severe RIGHT temporomandibular
osteoarthrosis.

CT CERVICAL SPINE FINDINGS

Cervical vertebral bodies intact. Grade 1 C4-5 anterolisthesis on
degenerative basis. Multilevel severe facet arthropathy with diffuse
C3-4 facets on degenerative basis. C1-2 articulation maintained with
severe arthropathy. Moderate to severe C4-5 through C6-7 disc height
loss and endplate spurring compatible with degenerative discs.
Congenitally capacious canal. No destructive bony lesions. Severe
LEFT C3-4, moderate to severe bilateral C5-6 and LEFT C6-7 neural
foraminal narrowing.

No prevertebral soft tissue swelling.
IMPRESSION: CT HEAD: No acute intracranial process.

Stable appearance of the head: Moderate to severe global brain
atrophy with moderate to severe chronic small vessel ischemic
disease.

CT CERVICAL SPINE: No acute fracture. Grade 1 C4-5 anterolisthesis
on degenerative basis.

## 2018-05-21 IMAGING — CR DG CHEST 2V
2 series · 2 of 2 positions shown · non-contrast
Comparison: Prior radiograph from 09/30/2015.

CLINICAL DATA: Initial evaluation for acute sepsis.

EXAM:
CHEST  2 VIEW

[x chest ap]
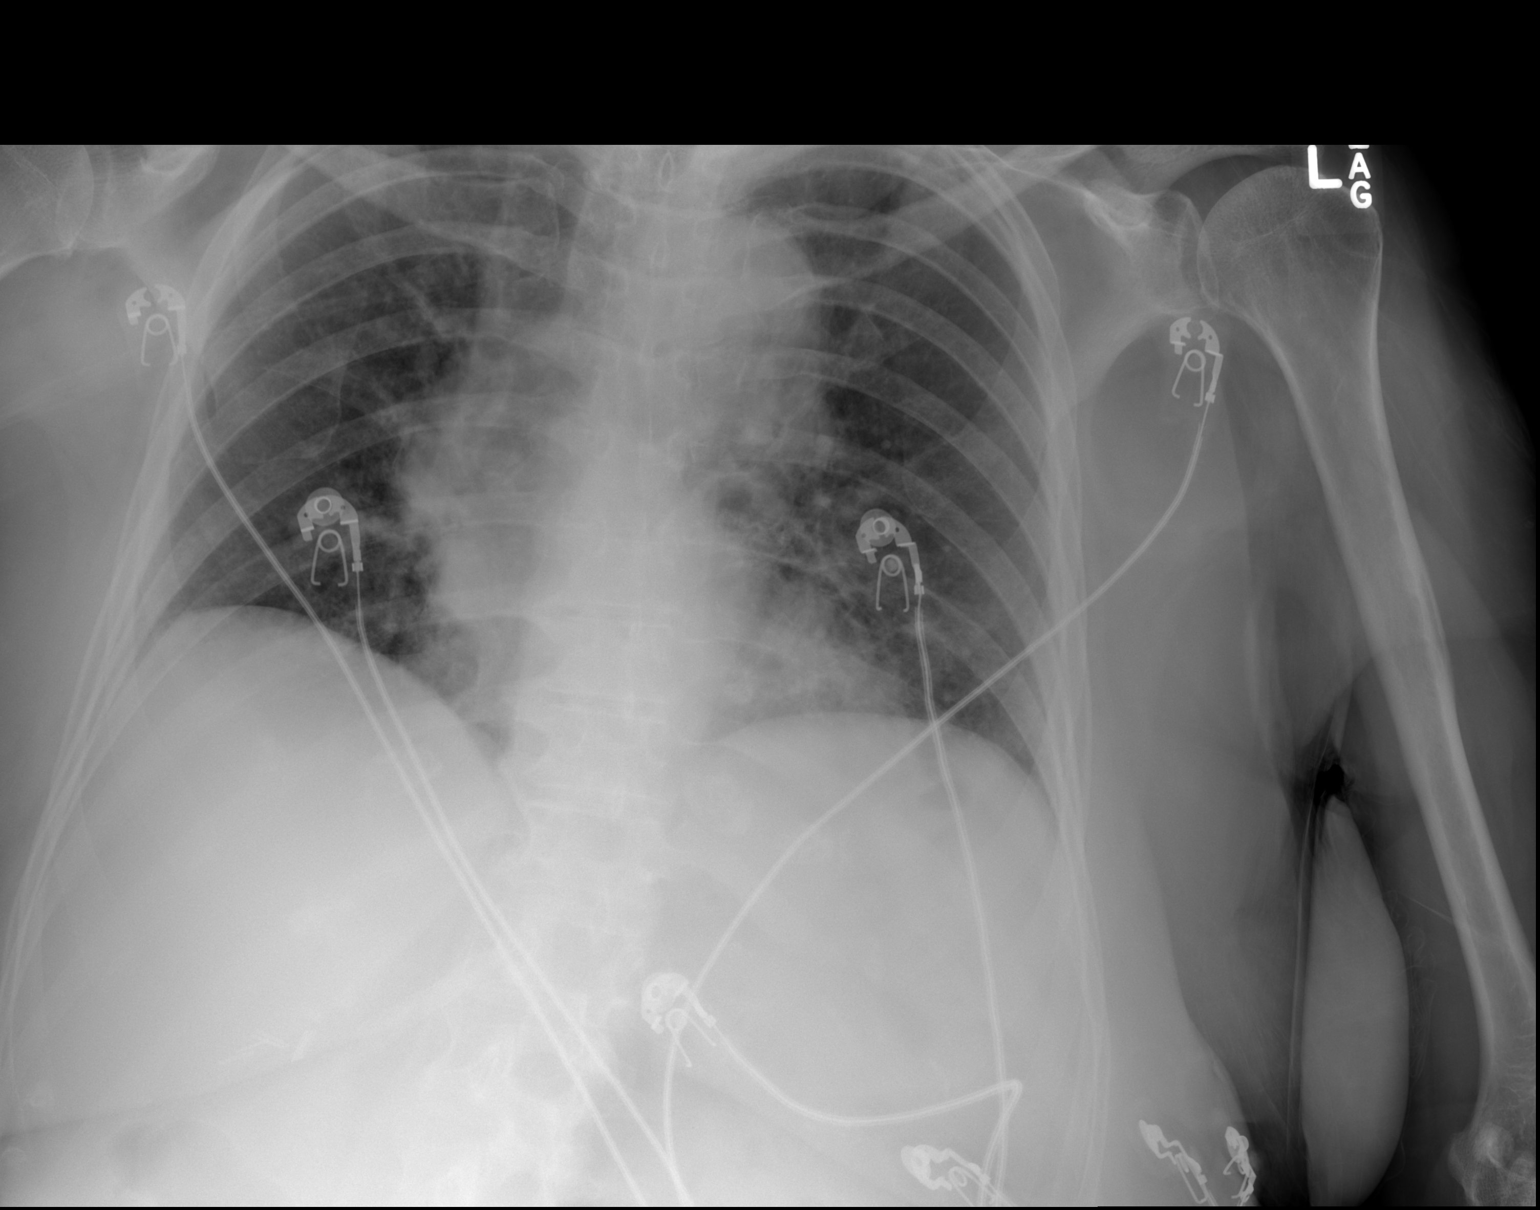

[w chest lat]
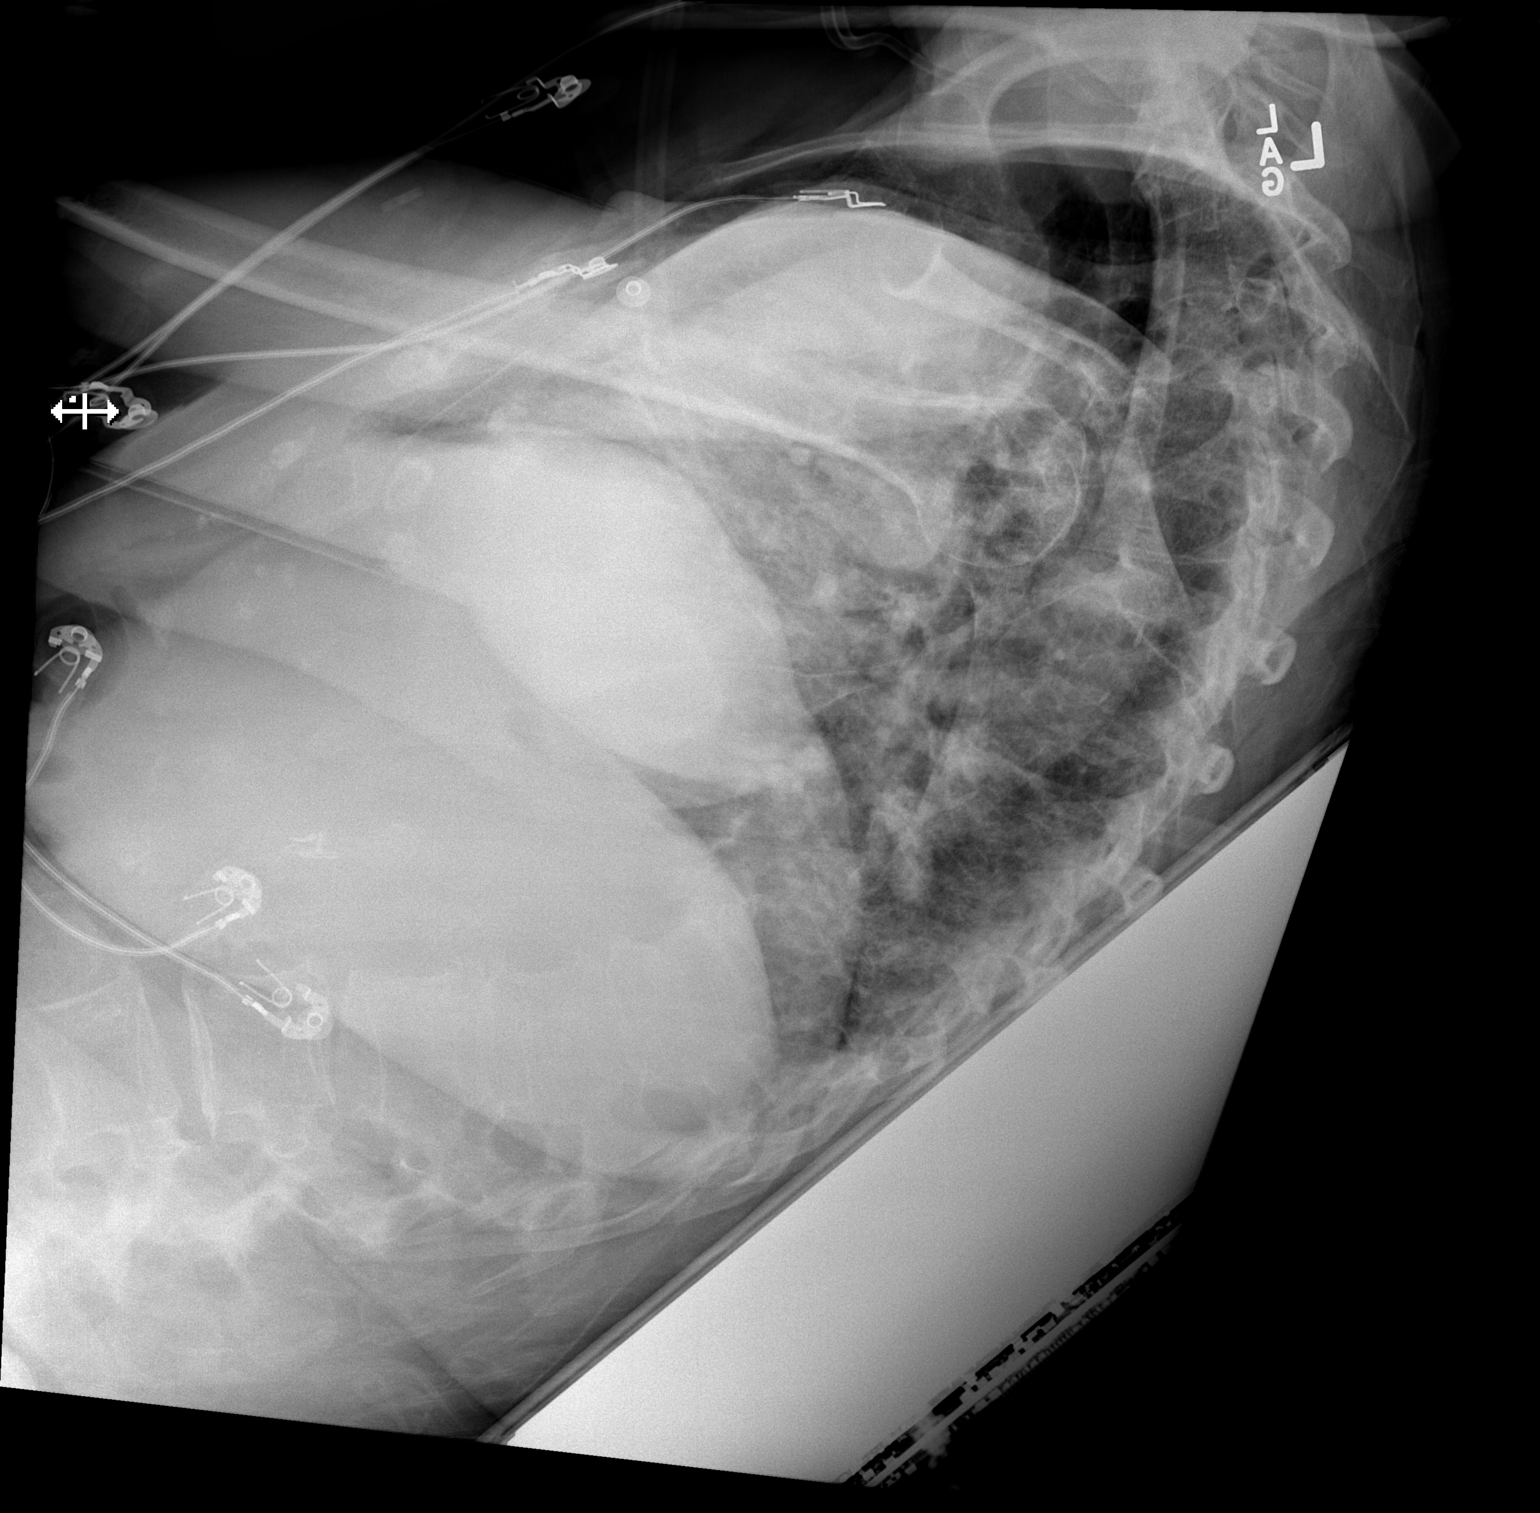

[2 of 2 positions shown; findings below may reference images not displayed]

FINDINGS: Patient is rotated to the right.

Cardiac and mediastinal silhouettes are stable in size and contour,
and remain within normal limits.

Lungs are hypoinflated. Patchy bibasilar and perihilar opacities,
which may reflect atelectasis and/ or infiltrates. Additionally,
asymmetric opacity within the right upper lobe, which may reflect
additional infiltrate. No pulmonary edema or pleural effusion. No
pneumothorax.

No acute osseus abnormality. Multilevel degenerative spondylolysis
noted within the visualized spine.
IMPRESSION: 1. Shallow lung inflation with patchy perihilar and bibasilar
airspace opacities. While these findings may in part reflect
atelectasis, possible infiltrates should be considered given the
provided history of sepsis.
2. Additional asymmetric hazy opacity within the right upper lobe,
also concerning for possible infection.
